# Patient Record
Sex: Male | Born: 1980 | Race: White | Hispanic: No | Marital: Married | Smoking: Never smoker
Health system: Southern US, Community
[De-identification: ages and names within clinical notes are randomized; demographics above are authoritative.]

## PROBLEM LIST (undated history)

## (undated) DIAGNOSIS — S3992XA Unspecified injury of lower back, initial encounter: Secondary | ICD-10-CM

## (undated) DIAGNOSIS — M2550 Pain in unspecified joint: Secondary | ICD-10-CM

## (undated) DIAGNOSIS — J302 Other seasonal allergic rhinitis: Secondary | ICD-10-CM

## (undated) DIAGNOSIS — T7840XA Allergy, unspecified, initial encounter: Secondary | ICD-10-CM

## (undated) DIAGNOSIS — R7303 Prediabetes: Secondary | ICD-10-CM

## (undated) DIAGNOSIS — K760 Fatty (change of) liver, not elsewhere classified: Secondary | ICD-10-CM

## (undated) DIAGNOSIS — M549 Dorsalgia, unspecified: Secondary | ICD-10-CM

## (undated) DIAGNOSIS — N200 Calculus of kidney: Secondary | ICD-10-CM

## (undated) DIAGNOSIS — Z9109 Other allergy status, other than to drugs and biological substances: Secondary | ICD-10-CM

## (undated) HISTORY — DX: Allergy, unspecified, initial encounter: T78.40XA

## (undated) HISTORY — DX: Unspecified injury of lower back, initial encounter: S39.92XA

## (undated) HISTORY — DX: Prediabetes: R73.03

## (undated) HISTORY — DX: Dorsalgia, unspecified: M54.9

## (undated) HISTORY — DX: Calculus of kidney: N20.0

## (undated) HISTORY — DX: Pain in unspecified joint: M25.50

## (undated) HISTORY — DX: Fatty (change of) liver, not elsewhere classified: K76.0

---
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
## 2012-06-24 ENCOUNTER — Ambulatory Visit: Payer: 59

## 2012-06-24 ENCOUNTER — Ambulatory Visit (INDEPENDENT_AMBULATORY_CARE_PROVIDER_SITE_OTHER): Payer: 59 | Admitting: Internal Medicine

## 2012-06-24 VITALS — BP 118/86 | HR 77 | Temp 97.6°F | Resp 16 | Ht 69.5 in | Wt 226.0 lb

## 2012-06-24 DIAGNOSIS — S1093XA Contusion of unspecified part of neck, initial encounter: Secondary | ICD-10-CM

## 2012-06-24 DIAGNOSIS — R51 Headache: Secondary | ICD-10-CM

## 2012-06-24 DIAGNOSIS — S0093XA Contusion of unspecified part of head, initial encounter: Secondary | ICD-10-CM

## 2012-06-24 DIAGNOSIS — S060X9A Concussion with loss of consciousness of unspecified duration, initial encounter: Secondary | ICD-10-CM

## 2012-06-24 MED ORDER — HYDROCODONE-ACETAMINOPHEN 5-500 MG PO TABS: 1.0000 | ORAL_TABLET | Freq: Three times a day (TID) | ORAL | Status: AC | PRN

## 2012-06-24 NOTE — Patient Instructions (Addendum)
Concussion Direct trauma to the head often causes a condition known as a concussion. This injury will interfere with brain function and may cause you to lose consciousness. The consequences of a concussion are usually temporary, but repetitive concussions can be very dangerous. If you have multiple concussions, you will have a greater risk of long-term effects, such as slurred speech, slow movements, impaired thinking, or tremors. The severity of a concussion is based on the length and severity of the interference with brain activity. SYMPTOMS  Symptoms of a concussion vary depending on the severity of the injury. Very mild concussions may even occur without any noticeable symptoms. Swelling in the area of the injury is not related to the seriousness of the injury.   Mild concussion:   Temporary loss of consciousness.   Memory loss (amnesia) for a short time.   Emotional instability.   Confusion.   Severe concussion:   Usually prolonged loss of consciousness.   One pupil (the black part in the middle of the eye) is larger than the other.   Changes in vision (including blurring).   Changes in breathing.   Disturbed balance (equilibrium).   Headaches.   Confusion.   Nausea or vomiting.  CAUSES  A concussion is the result of trauma to the head. When the head is subjected to such an injury, the brain strikes against the inner wall of the skull. This impact is what causes the damage to the brain. The force of injury is related to severity of injury. The most severe concussions are associated with incidents that involve large impact forces such as motor vehicle accidents. Wearing a helmet will reduce the severity of trauma to the head, but concussions may still occur if you are wearing a helmet. RISK INCREASES WITH:  Contact sports (football, hockey, rugby, or lacrosse).   Fighting sports (martial arts or boxing).   Riding bicycles, motorcycles, or horses (when you ride without a  helmet).  PREVENTION  Wear proper protective headgear and ensure correct fit.   Wear seat belts when driving and riding in a car.   Do not drink or use mind-altering drugs and drive.  PROGNOSIS  Concussions are typically curable if they are recognized and treated early. If a severe concussion or multiple concussions go untreated, then the complications may be life-threatening or cause permanent disability and brain damage. RELATED COMPLICATIONS   Permanent brain damage (slurred speech, slow movement, impaired thinking, or tremors).   Bleeding under the skull (subdural hemorrhage or hematoma, epidural hematoma).   Bleeding into the brain.   Prolonged healing time if usual activities are resumed too soon.   Infection if skin over the concussion site is broken.   Increased risk of future concussions (less trauma is required for a second concussion than the first).  TREATMENT  Treatment initially requires immediate evaluation to determine the severity of the concussion. Occasionally, a hospital stay may be required for observation and treatment.  Avoid exertion. Bed rest for the first 24 to 48 hours is recommended.  Return to play is a controversial subject due to the increased risk for future injury as well as permanent disability and should be discussed at length with your treating caregiver. Many factors such as the severity of the concussion and whether this is the first, second, or third concussion play a role in timing a patient's return to sports.  MEDICATION  Do not give any medicine, including non-prescription acetaminophen or aspirin, until the diagnosis is certain. These medicines may mask  developing symptoms.  SEEK IMMEDIATE MEDICAL CARE IF:   Symptoms get worse or do not improve in 24 hours.   Any of the following symptoms occur:   Vomiting.   The inability to move arms and legs equally well on both sides.   Fever.   Neck stiffness.   Pupils of unequal size, shape,  or reactivity.   Convulsions.   Noticeable restlessness.   Severe headache that persists for longer than 4 hours after injury.   Confusion, disorientation, or mental status changes.  Document Released: 11/12/2005 Document Revised: 11/01/2011 Document Reviewed: 02/24/2009 [facility] Patient Information 2012 [place], [place].

## 2012-06-24 NOTE — Progress Notes (Signed)
  Subjective:    Patient ID: Jerry Schmidt, male    DOB: Jul 25, 1981, 31 y.o.   MRN: [id number]  HPI Usually healthy guy was struk on left side of face by high fly ball yesterday. Worked all day but does not feel right. Fuzzy headed and mild dizzy. No loc, nausea. Just not thinking as fast. No focal nms loss. No diplopia or vision change. Does have left tmj pain and cannot chew nor open mouth but 25%.   Review of Systems     Objective:   Physical Exam  Constitutional: He is oriented to person, place, and time. He appears well-developed and well-nourished.  HENT:  Right Ear: External ear normal.  Left Ear: External ear normal.  Nose: Nose normal.  Eyes: Conjunctivae and EOM are normal. Pupils are equal, round, and reactive to light.  Neck: Normal range of motion. Neck supple.  Pulmonary/Chest: Effort normal.  Neurological: He is alert and oriented to person, place, and time. He has normal strength and normal reflexes. He is not disoriented. He displays no tremor and normal reflexes. No cranial nerve deficit or sensory deficit. He exhibits normal muscle tone. He displays a negative Romberg sign. Coordination and gait normal.       Balance excellent No drift Anxious  Skin: Skin is warm, dry and intact. Ecchymosis noted.          Edema left front-parietal-tmj area  Psychiatric: He has a normal mood and affect. His behavior is normal. Judgment and thought content normal.   [facility] reading (PRIMARY) by  Dr.Guest no fx seen         Assessment & Plan:  See dentist soon to asses TMJ and bite Head injury care RTC 2 days if not much better

## 2012-06-25 ENCOUNTER — Telehealth: Payer: Self-pay | Admitting: Radiology

## 2012-06-25 NOTE — Telephone Encounter (Signed)
Patient called today his rx was not sent in to [facility] last pm, I called it in for him.

## 2013-03-18 ENCOUNTER — Encounter (HOSPITAL_COMMUNITY): Payer: Self-pay | Admitting: *Deleted

## 2013-03-18 ENCOUNTER — Emergency Department (HOSPITAL_COMMUNITY)
Admission: EM | Admit: 2013-03-18 | Discharge: 2013-03-18 | Disposition: A | Discharge status: Home / Self Care | Payer: 59 | Source: Home / Self Care | Attending: Family Medicine | Admitting: Family Medicine

## 2013-03-18 DIAGNOSIS — J329 Chronic sinusitis, unspecified: Secondary | ICD-10-CM

## 2013-03-18 DIAGNOSIS — J40 Bronchitis, not specified as acute or chronic: Secondary | ICD-10-CM

## 2013-03-18 HISTORY — DX: Other allergy status, other than to drugs and biological substances: Z91.09

## 2013-03-18 HISTORY — DX: Other seasonal allergic rhinitis: J30.2

## 2013-03-18 MED ORDER — PREDNISONE 20 MG PO TABS: ORAL_TABLET | ORAL | Status: DC

## 2013-03-18 MED ORDER — ALBUTEROL SULFATE HFA 108 (90 BASE) MCG/ACT IN AERS: 1.0000 | INHALATION_SPRAY | Freq: Four times a day (QID) | RESPIRATORY_TRACT | Status: DC | PRN

## 2013-03-18 MED ORDER — GUAIFENESIN-CODEINE 100-10 MG/5ML PO SYRP: 5.0000 mL | ORAL_SOLUTION | Freq: Three times a day (TID) | ORAL | Status: DC | PRN

## 2013-03-18 MED ORDER — AZITHROMYCIN 250 MG PO TABS: ORAL_TABLET | ORAL | Status: DC

## 2013-03-18 MED ORDER — FLUTICASONE PROPIONATE 50 MCG/ACT NA SUSP
2.0000 | Freq: Every day | NASAL | Status: DC
Start: 2013-03-18 — End: 2013-12-07

## 2013-03-18 MED ORDER — FLUTICASONE PROPIONATE 50 MCG/ACT NA SUSP: 2.0000 | Freq: Every day | NASAL | Status: DC

## 2013-03-18 NOTE — ED Notes (Signed)
C/o prod. Cough yellowish green at night and clear during the day onset 2 weeks ago.  Taking Zyrtec.  C/o runny nose and post nasal drip.  No chills or fever or earache.  Has sore throat in AM from mouth breathing at night.

## 2013-03-18 NOTE — ED Provider Notes (Signed)
History     CSN: [id number]  Arrival date & time 03/18/13  1434   First MD Initiated Contact with Patient 03/18/13 1520      Chief Complaint  Patient presents with  . Cough    (Consider location/radiation/quality/duration/timing/severity/associated sxs/prior treatment) HPI Comments: 32 year old male with history of seasonal allergies and recurrent days. Here complaining of nasal congestion, rhinorrhea, postnasal drip, sneezing, watery eyes, associated with productive cough and wheezing for 2 weeks. Patient reports worsening symptoms with thickening of his nasal discharge and sputum, he is currently unable to breathe through his nose. Also reports chest pressure and decreased energy. Denies fever or chills. Denies appetite changes. No abdominal pain nausea vomiting or diarrhea. Taking Zyrtec daily. Has used heat child albuterol with some relief.   Past Medical History  Diagnosis Date  . Pollen allergies   . Seasonal allergies     Past Surgical History  Procedure Laterality Date  . 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 No pertinent family history.  History  Substance Use Topics  . Smoking status: Never Smoker   . Smokeless tobacco: Not on file  . Alcohol Use: No      Review of Systems  Constitutional: Positive for fatigue. Negative for fever, chills, diaphoresis and appetite change.  HENT: Positive for congestion, rhinorrhea, sneezing, postnasal drip and sinus pressure. Negative for trouble swallowing.   Respiratory: Positive for cough, chest tightness, shortness of breath and wheezing.   Cardiovascular: Negative for chest pain, palpitations and leg swelling.  Gastrointestinal: Negative for nausea, vomiting, abdominal pain and diarrhea.  Musculoskeletal: Negative for arthralgias.  Skin: Negative for rash.  Neurological: Positive for headaches.    Allergies  Review of patient's allergies indicates no known  allergies.  Home Medications   Current Outpatient Rx  Name  Route  Sig  Dispense  Refill  . cetirizine (ZYRTEC) 10 MG tablet   Oral   Take 10 mg by mouth daily.         Marland Kitchen albuterol (PROVENTIL HFA;VENTOLIN HFA) 108 (90 BASE) MCG/ACT inhaler   Inhalation   Inhale 1-2 puffs into the lungs every 6 (six) hours as needed for wheezing.   1 Inhaler   0   . azithromycin (ZITHROMAX) 250 MG tablet      2 tabs by mouth on day one then one tablet daily for 4 more days.   6 tablet   0   . fluticasone (FLONASE) 50 MCG/ACT nasal spray   Nasal   Place 2 sprays into the nose daily.   16 g   0   . guaiFENesin-codeine (ROBITUSSIN AC) 100-10 MG/5ML syrup   Oral   Take 5 mLs by mouth 3 (three) times daily as needed for cough.   120 mL   0   . predniSONE (DELTASONE) 20 MG tablet      2 tabs po daily for 5 days   10 tablet   0     BP 135/74  Pulse 97  Temp(Src) 98.1 F (36.7 C) (Oral)  Resp 18  SpO2 95%  Physical Exam  Nursing note and vitals reviewed. Constitutional: He is oriented to person, place, and time. He appears well-developed and well-nourished. No distress.  HENT:  Head: Normocephalic and atraumatic.  Nasal Congestion with erythema and swelling of nasal turbinates, yellow rhinorrhea. Significant pharyngeal erythema no exudates. No uvula deviation. No trismus. TM's normal  Neck: Neck supple. No JVD present.  Cardiovascular: Normal rate, regular rhythm, normal heart sounds and intact distal pulses.  Exam reveals no gallop and no friction rub.   No murmur heard. Pulmonary/Chest: Effort normal. He has no wheezes. He has no rales. He exhibits no tenderness.  Bilaterally intermittent expiratory rhonchi. No active wheezing. No orthopnea. No tachypnea.  Lymphadenopathy:    He has no cervical adenopathy.  Neurological: He is alert and oriented to person, place, and time.  Skin: No rash noted. He is not diaphoretic.    ED Course  Procedures (including critical care  time)  Labs Reviewed - No data to display No results found.   1. Bronchitis   2. Sinusitis       MDM  Treated with albuterol, prednisone, guaifenesin/codeine, fluticasone and a azithromycin. Supportive care and red flags should prompt his return to medical attention discussed with patient and provided in writing.        Sharin Grave, MD 03/18/13 1654

## 2013-12-07 ENCOUNTER — Encounter (HOSPITAL_COMMUNITY): Payer: Self-pay | Admitting: Emergency Medicine

## 2013-12-07 ENCOUNTER — Emergency Department (INDEPENDENT_AMBULATORY_CARE_PROVIDER_SITE_OTHER)
Admission: EM | Admit: 2013-12-07 | Discharge: 2013-12-07 | Disposition: A | Discharge status: Home / Self Care | Payer: 59 | Source: Home / Self Care | Attending: Family Medicine | Admitting: Family Medicine

## 2013-12-07 DIAGNOSIS — J329 Chronic sinusitis, unspecified: Secondary | ICD-10-CM

## 2013-12-07 MED ORDER — AMOXICILLIN-POT CLAVULANATE 875-125 MG PO TABS: 1.0000 | ORAL_TABLET | Freq: Two times a day (BID) | ORAL | Status: DC

## 2013-12-07 MED ORDER — IPRATROPIUM BROMIDE 0.06 % NA SOLN: 2.0000 | Freq: Four times a day (QID) | NASAL | Status: DC

## 2013-12-07 MED ORDER — PREDNISONE 10 MG PO TABS: 30.0000 mg | ORAL_TABLET | Freq: Every day | ORAL | Status: DC

## 2013-12-07 MED ORDER — GUAIFENESIN-CODEINE 100-10 MG/5ML PO SOLN: 5.0000 mL | Freq: Every evening | ORAL | Status: DC | PRN

## 2013-12-07 NOTE — ED Notes (Addendum)
Visual  Acuity  20/15  l  20/20  r  20/20  both

## 2013-12-07 NOTE — ED Provider Notes (Signed)
Lorn JunesChristopher C Gest is a 33 y.o. male who presents to Urgent Care today for fevers chills sinus pain and pressure. Pressure and pain is worse behind the left eye and left forehead. She additionally notes purulent nasal discharge. He also has subjective fevers chills and sweats. His symptoms are present for 2 weeks however over the past day or so they have been worsening. He's tried NyQuil, DayQuil, and Zyrtec. This is helped only a little. His pain is moderate. He denies any significant.    Past Medical History  Diagnosis Date  . Pollen allergies   . Seasonal allergies    History  Substance Use Topics  . Smoking status: Never Smoker   . Smokeless tobacco: Not on file  . Alcohol Use: Yes   ROS as above Medications reviewed. No current facility-administered medications for this encounter.   Current Outpatient Prescriptions  Medication Sig Dispense Refill  . albuterol (PROVENTIL HFA;VENTOLIN HFA) 108 (90 BASE) MCG/ACT inhaler Inhale 1-2 puffs into the lungs every 6 (six) hours as needed for wheezing.  1 Inhaler  0  . amoxicillin-clavulanate (AUGMENTIN) 875-125 MG per tablet Take 1 tablet by mouth every 12 (twelve) hours.  14 tablet  0  . guaiFENesin-codeine 100-10 MG/5ML syrup Take 5 mLs by mouth at bedtime as needed for cough.  120 mL  0  . ipratropium (ATROVENT) 0.06 % nasal spray Place 2 sprays into both nostrils 4 (four) times daily.  15 mL  1  . predniSONE (DELTASONE) 10 MG tablet Take 3 tablets (30 mg total) by mouth daily.  15 tablet  0  . [DISCONTINUED] cetirizine (ZYRTEC) 10 MG tablet Take 10 mg by mouth daily.      . [DISCONTINUED] fluticasone (FLONASE) 50 MCG/ACT nasal spray Place 2 sprays into the nose daily.  16 g  0    Exam:  BP 140/84  Pulse 88  Temp(Src) 99.4 F (37.4 C) (Oral)  Resp 18  SpO2 99% Gen: Well NAD HEENT: EOMI,  MMM, PERRLA. The patient membranes are normal appearing bilaterally. Posterior pharynx with cobblestoning. Nasal turbinates are mildly inflamed.  Tender palpation left frontal sinus. Lungs: Normal work of breathing. CTABL Heart: RRR no MRG Abd: NABS, Soft. NT, ND Exts: Non edematous BL  LE, warm and well perfused.     Visual Acuity  Right Eye Distance:   20/20 Left Eye Distance:   20/15 Bilateral Distance:   20/20   Assessment and Plan: 33 y.o. male with sinusitis. Plan to treat with Augmentin, low dose prednisone, Atrovent nasal spray, and NSAIDs. We will also use codeine containing cough medication for use at bedtime as needed. I verbally cautioned patient against using this medication at work or while driving. Additionally we'll provide work.  Discussed warning signs or symptoms. Please see discharge instructions. Patient expresses understanding.    Rodolph BongEvan S Chayce Rullo, MD 12/07/13 [phone]

## 2013-12-07 NOTE — ED Notes (Signed)
Pt  Reports  Symptoms  Of a  persistant  Cough  With  Sinus  Congestion  /  Drainage        With  The  Symptoms  X  2  Weeks           Pt  Reports  The     Symptoms  Of a  sorethroat  As  Well

## 2013-12-07 NOTE — Discharge Instructions (Signed)
Thank you for coming in today. Take Augmentin twice daily for one week. Used prednisone daily for one week. Use Atrovent nasal spray. Take up to 2 Aleve twice daily for pain. Call or go to the emergency room if you get worse, have trouble breathing, have chest pains, or palpitations.   Sinusitis Sinusitis is redness, soreness, and swelling (inflammation) of the paranasal sinuses. Paranasal sinuses are air pockets within the bones of your face (beneath the eyes, the middle of the forehead, or above the eyes). In healthy paranasal sinuses, mucus is able to drain out, and air is able to circulate through them by way of your nose. However, when your paranasal sinuses are inflamed, mucus and air can become trapped. This can allow bacteria and other germs to grow and cause infection. Sinusitis can develop quickly and last only a short time (acute) or continue over a long period (chronic). Sinusitis that lasts for more than 12 weeks is considered chronic.  CAUSES  Causes of sinusitis include:  Allergies.  Structural abnormalities, such as displacement of the cartilage that separates your nostrils (deviated septum), which can decrease the air flow through your nose and sinuses and affect sinus drainage.  Functional abnormalities, such as when the small hairs (cilia) that line your sinuses and help remove mucus do not work properly or are not present. SYMPTOMS  Symptoms of acute and chronic sinusitis are the same. The primary symptoms are pain and pressure around the affected sinuses. Other symptoms include:  Upper toothache.  Earache.  Headache.  Bad breath.  Decreased sense of smell and taste.  A cough, which worsens when you are lying flat.  Fatigue.  Fever.  Thick drainage from your nose, which often is green and may contain pus (purulent).  Swelling and warmth over the affected sinuses. DIAGNOSIS  Your caregiver will perform a physical exam. During the exam, your caregiver  may:  Look in your nose for signs of abnormal growths in your nostrils (nasal polyps).  Tap over the affected sinus to check for signs of infection.  View the inside of your sinuses (endoscopy) with a special imaging device with a light attached (endoscope), which is inserted into your sinuses. If your caregiver suspects that you have chronic sinusitis, one or more of the following tests may be recommended:  Allergy tests.  Nasal culture A sample of mucus is taken from your nose and sent to a lab and screened for bacteria.  Nasal cytology A sample of mucus is taken from your nose and examined by your caregiver to determine if your sinusitis is related to an allergy. TREATMENT  Most cases of acute sinusitis are related to a viral infection and will resolve on their own within 10 days. Sometimes medicines are prescribed to help relieve symptoms (pain medicine, decongestants, nasal steroid sprays, or saline sprays).  However, for sinusitis related to a bacterial infection, your caregiver will prescribe antibiotic medicines. These are medicines that will help kill the bacteria causing the infection.  Rarely, sinusitis is caused by a fungal infection. In theses cases, your caregiver will prescribe antifungal medicine. For some cases of chronic sinusitis, surgery is needed. Generally, these are cases in which sinusitis recurs more than 3 times per year, despite other treatments. HOME CARE INSTRUCTIONS   Drink plenty of water. Water helps thin the mucus so your sinuses can drain more easily.  Use a humidifier.  Inhale steam 3 to 4 times a day (for example, sit in the bathroom with the shower running).  Apply a warm, moist washcloth to your face 3 to 4 times a day, or as directed by your caregiver.  Use saline nasal sprays to help moisten and clean your sinuses.  Take over-the-counter or prescription medicines for pain, discomfort, or fever only as directed by your caregiver. SEEK IMMEDIATE  MEDICAL CARE IF:  You have increasing pain or severe headaches.  You have nausea, vomiting, or drowsiness.  You have swelling around your face.  You have vision problems.  You have a stiff neck.  You have difficulty breathing. MAKE SURE YOU:   Understand these instructions.  Will watch your condition.  Will get help right away if you are not doing well or get worse. Document Released: 11/12/2005 Document Revised: 02/04/2012 Document Reviewed: 11/27/2011 [facility] Patient Information 2014 [place].

## 2014-02-25 ENCOUNTER — Ambulatory Visit (INDEPENDENT_AMBULATORY_CARE_PROVIDER_SITE_OTHER)
Admission: RE | Admit: 2014-02-25 | Discharge: 2014-02-25 | Disposition: A | Discharge status: Home / Self Care | Payer: 59 | Source: Ambulatory Visit | Attending: Family Medicine | Admitting: Family Medicine

## 2014-02-25 ENCOUNTER — Encounter: Payer: Self-pay | Admitting: Family Medicine

## 2014-02-25 ENCOUNTER — Ambulatory Visit (INDEPENDENT_AMBULATORY_CARE_PROVIDER_SITE_OTHER): Payer: 59 | Admitting: Family Medicine

## 2014-02-25 VITALS — BP 126/66 | HR 82 | Temp 98.1°F | Ht 69.25 in | Wt 230.5 lb

## 2014-02-25 DIAGNOSIS — IMO0002 Reserved for concepts with insufficient information to code with codable children: Secondary | ICD-10-CM

## 2014-02-25 DIAGNOSIS — Z136 Encounter for screening for cardiovascular disorders: Secondary | ICD-10-CM

## 2014-02-25 DIAGNOSIS — S3992XA Unspecified injury of lower back, initial encounter: Secondary | ICD-10-CM

## 2014-02-25 DIAGNOSIS — Z0000 Encounter for general adult medical examination without abnormal findings: Secondary | ICD-10-CM

## 2014-02-25 DIAGNOSIS — R5383 Other fatigue: Secondary | ICD-10-CM

## 2014-02-25 DIAGNOSIS — R5381 Other malaise: Secondary | ICD-10-CM

## 2014-02-25 HISTORY — DX: Unspecified injury of lower back, initial encounter: S39.92XA

## 2014-02-25 LAB — CBC WITH DIFFERENTIAL/PLATELET
Basophils Absolute: 0 10*3/uL (ref 0.0–0.1)
Basophils Relative: 0 % (ref 0–1)
EOS ABS: 0.3 10*3/uL (ref 0.0–0.7)
Eosinophils Relative: 4 % (ref 0–5)
HCT: 40 % (ref 39.0–52.0)
HEMOGLOBIN: 14 g/dL (ref 13.0–17.0)
LYMPHS ABS: 2 10*3/uL (ref 0.7–4.0)
LYMPHS PCT: 30 % (ref 12–46)
MCH: 27.7 pg (ref 26.0–34.0)
MCHC: 35 g/dL (ref 30.0–36.0)
MCV: 79.1 fL (ref 78.0–100.0)
MONOS PCT: 7 % (ref 3–12)
Monocytes Absolute: 0.5 10*3/uL (ref 0.1–1.0)
NEUTROS PCT: 59 % (ref 43–77)
Neutro Abs: 4 10*3/uL (ref 1.7–7.7)
Platelets: 177 10*3/uL (ref 150–400)
RBC: 5.06 MIL/uL (ref 4.22–5.81)
RDW: 14.3 % (ref 11.5–15.5)
WBC: 6.7 10*3/uL (ref 4.0–10.5)

## 2014-02-25 MED ORDER — CYCLOBENZAPRINE HCL 5 MG PO TABS: ORAL_TABLET | ORAL | Status: DC

## 2014-02-25 NOTE — Assessment & Plan Note (Signed)
No TTP over spine- exam reassuring. Probable muscle spasm. Will treat with prn flexeril and heat at bedtime. Given duration of symptoms, will get xray today.  Call or return to clinic prn if these symptoms worsen or fail to improve as anticipated. The patient indicates understanding of these issues and agrees with the plan.

## 2014-02-25 NOTE — Progress Notes (Signed)
Pre visit review using our clinic review tool, if applicable. No additional management support is needed unless otherwise documented below in the visit note. 

## 2014-02-25 NOTE — Assessment & Plan Note (Signed)
Reviewed preventive care protocols, scheduled due services, and updated immunizations Discussed nutrition, exercise, diet, and healthy lifestyle. Orders Placed This Encounter  Procedures  . DG Thoracic Spine W/Swimmers  . Comprehensive metabolic panel  . Lipid panel  . Vitamin B12  . CBC with Differential

## 2014-02-25 NOTE — Progress Notes (Signed)
   Subjective:   Patient ID: Jerry Schmidt, male    DOB: 10/12/1981, 33 y.o.   MRN: [id number]  Jerry Schmidt is a pleasant 33 y.o. year old male who presents to clinic today with Establish Care and Back Pain  on 02/25/2014  HPI: Due for CPX.   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  Has been more fatigued lately.  Works long hours.  Denies depression, decreased sex drive or difficulty getting or maintaining an erection.  Back injury- fell off back of his truck (approx 2 feet) 1 month ago.  Landed on his back.  Since then, upper back hurts.  Worse after long day at work.  Has not tried heating pad but NSAIDs have not worked.  No tingling in UE.  No UE weakness.  Patient Active Problem List   Diagnosis Date Noted  . Back injury 02/25/2014  . Routine general medical examination at a health care facility 02/25/2014   Past Medical History  Diagnosis Date  . Pollen allergies   . Seasonal allergies    Past Surgical History  Procedure Laterality Date  . 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 Smoking status: Never Smoker   . Smokeless tobacco: Not on file  . Alcohol Use: Yes   No family history on file. No Known Allergies Current Outpatient Prescriptions on File Prior to Visit  Medication Sig Dispense Refill  . [DISCONTINUED] cetirizine (ZYRTEC) 10 MG tablet Take 10 mg by mouth daily.      . [DISCONTINUED] fluticasone (FLONASE) 50 MCG/ACT nasal spray Place 2 sprays into the nose daily.  16 g  0   No current facility-administered medications on file prior to visit.   The PMH, PSH, Social History, Family History, Medications, and allergies have been [facility], and have been updated if relevant.   Review of Systems    See HPI Denies HA No blurred vision No change in bowel habits +dry skin  Objective:    BP 126/66  Pulse 82  Temp(Src) 98.1 F (36.7 C) (Oral)  Ht  5' 9.25" (1.759 m)  Wt 230 lb 8 oz (104.554 kg)  BMI 33.79 kg/m2  SpO2 98%   Physical Exam  Nursing note and vitals reviewed. Constitutional: He is oriented to person, place, and time. He appears well-developed and well-nourished. No distress.  HENT:  Head: Normocephalic and atraumatic.  Eyes: Pupils are equal, round, and reactive to light.  Neck: Normal range of motion. Neck supple.  Cardiovascular: Normal rate and regular rhythm.   No murmur heard. Pulmonary/Chest: Effort normal and breath sounds normal. No respiratory distress.  Abdominal: Soft. Bowel sounds are normal.  Musculoskeletal: He exhibits no edema.       Thoracic back: He exhibits spasm. He exhibits normal range of motion, no tenderness, no bony tenderness, no swelling, no edema, no deformity, no laceration, no pain and normal pulse.  Neurological: He is alert and oriented to person, place, and time.  Skin: Skin is warm and dry.  Psychiatric: He has a normal mood and affect. His behavior is normal. Judgment and thought content normal.          Assessment & Plan:   Back injury No Follow-up on file.

## 2014-02-25 NOTE — Patient Instructions (Signed)
Nice to meet you. We will call you with your xray and lab results.  Take flexeril as needed at bedtime. Use heating pad at night.

## 2014-02-26 LAB — LIPID PANEL
CHOLESTEROL: 187 mg/dL (ref 0–200)
HDL: 30 mg/dL — ABNORMAL LOW (ref >39)
LDL CALC: 79 mg/dL (ref 0–99)
TRIGLYCERIDES: 390 mg/dL — AB (ref <150)
Total CHOL/HDL Ratio: 6.2 Ratio
VLDL: 78 mg/dL — ABNORMAL HIGH (ref 0–40)

## 2014-02-26 LAB — COMPREHENSIVE METABOLIC PANEL
ALBUMIN: 4.6 g/dL (ref 3.5–5.2)
ALT: 36 U/L (ref 0–53)
AST: 20 U/L (ref 0–37)
Alkaline Phosphatase: 52 U/L (ref 39–117)
BUN: 18 mg/dL (ref 6–23)
CO2: 32 meq/L (ref 19–32)
Calcium: 9.5 mg/dL (ref 8.4–10.5)
Chloride: 103 mEq/L (ref 96–112)
Creat: 1.02 mg/dL (ref 0.50–1.35)
GLUCOSE: 55 mg/dL — AB (ref 70–99)
POTASSIUM: 4 meq/L (ref 3.5–5.3)
SODIUM: 140 meq/L (ref 135–145)
TOTAL PROTEIN: 6.9 g/dL (ref 6.0–8.3)
Total Bilirubin: 0.9 mg/dL (ref 0.2–1.2)

## 2014-02-26 LAB — VITAMIN B12: Vitamin B-12: 269 pg/mL (ref 211–911)

## 2014-03-01 ENCOUNTER — Encounter: Payer: Self-pay | Admitting: *Deleted

## 2014-07-01 ENCOUNTER — Encounter (HOSPITAL_COMMUNITY): Payer: Self-pay | Admitting: Emergency Medicine

## 2014-07-01 ENCOUNTER — Emergency Department (INDEPENDENT_AMBULATORY_CARE_PROVIDER_SITE_OTHER)
Admission: EM | Admit: 2014-07-01 | Discharge: 2014-07-01 | Disposition: A | Discharge status: Home / Self Care | Payer: 59 | Source: Home / Self Care | Attending: Emergency Medicine | Admitting: Emergency Medicine

## 2014-07-01 DIAGNOSIS — J069 Acute upper respiratory infection, unspecified: Secondary | ICD-10-CM

## 2014-07-01 DIAGNOSIS — J019 Acute sinusitis, unspecified: Secondary | ICD-10-CM

## 2014-07-01 MED ORDER — AMOXICILLIN 500 MG PO CAPS: 1000.0000 mg | ORAL_CAPSULE | Freq: Three times a day (TID) | ORAL | Status: DC

## 2014-07-01 MED ORDER — PREDNISONE 20 MG PO TABS: 20.0000 mg | ORAL_TABLET | Freq: Two times a day (BID) | ORAL | Status: DC

## 2014-07-01 NOTE — Discharge Instructions (Signed)
Most upper respiratory infections are caused by viruses and do not require antibiotics.  We try to save the antibiotics for when we really need them to prevent bacteria from developing resistance to them.  Here are a few hints about things that can be done at home to help get over an upper respiratory infection quicker: ° °Get extra sleep and extra fluids.  Get 7 to 9 hours of sleep per night and 6 to 8 glasses of water a day.  Getting extra sleep keeps the immune system from getting run down.  Most people with an upper respiratory infection are a little dehydrated.  The extra fluids also keep the secretions liquified and easier to deal with.  Also, get extra vitamin C.  4000 mg per day is the recommended dose. °For the aches, headache, and fever, acetaminophen or ibuprofen are helpful.  These can be alternated every 4 hours.  People with liver disease should avoid large amounts of acetaminophen, and people with ulcer disease, gastroesophageal reflux, gastritis, congestive heart failure, chronic kidney disease, coronary artery disease and the elderly should avoid ibuprofen. °For nasal congestion try Mucinex-D, or if you're having lots of sneezing or clear nasal drainage use Zyrtec-D. People with high blood pressure can take these if their blood pressure is controlled, if not, it's best to avoid the forms with a "D" (decongestants).  You can use the plain Mucinex, Allegra, Claritin, or Zyrtec even if your blood pressure is not controlled.   °A Saline nasal spray such as Ocean Spray can also help.  You can add a decongestant sprays such as Afrin, but you should not use the decongestant sprays for more than 3 or 4 days since they can be habituating.  Breathe Rite nasal strips can also offer a non-drug alternative treatment to nasal congestion, especially at night. °For people with symptoms of sinusitis, sleeping with your head elevated can be helpful.  For sinus pain, moist, hot compresses to the face may provide some  relief.  Many people find that inhaling steam as in a shower or from a pot of steaming water can help. °For any viral infection, zinc containing lozenges such as Cold-Eze or Zicam are helpful.  Zinc helps to fight viral infection.  Hot salt water gargles (8 oz of hot water, 1/2 tsp of table salt, and a pinch of baking soda) can give relief as well as hot beverages such as hot tea.  Sucrets extra strength lozenges will help the sore throat.  °For the cough, take Delsym 2 tsp every 12 hours.  It has also been found recently that Aleve can help control a cough.  The dose is 1 to 2 tablets twice daily with food.  This can be combined with Delsym. (Note, if you are taking ibuprofen, you should not take Aleve as well--take one or the other.) °A cool mist vaporizer will help keep your mucous membranes from drying out.  ° °It's important when you have an upper respiratory infection not to pass the infection to others.  This involves being very careful about the following: ° °Frequent hand washing or use of hand sanitizer, especially after coughing, sneezing, blowing your nose or touching your face, nose or eyes. °Do not shake hands or touch anyone and try to avoid touching surfaces that other people use such as doorknobs, shopping carts, telephones and computer keyboards. °Use tissues and dispose of them properly in a garbage can or ziplock bag. °Cough into your sleeve. °Do not let others eat or   drink after you. ° °It's also important to recognize the signs of serious illness and get evaluated if they occur: °Any respiratory infection that lasts more than 7 to 10 days.  Yellow nasal drainage and sputum are not reliable indicators of a bacterial infection, but if they last for more than 1 week, see your doctor. °Fever and sore throat can indicate strep. °Fever and cough can indicate influenza or pneumonia. °Any kind of severe symptom such as difficulty breathing, intractable vomiting, or severe pain should prompt you to see  a doctor as soon as possible. ° ° °Your body's immune system is really the thing that will get rid of this infection.  Your immune system is comprised of 2 types of specialized cells called T cells and B cells.  T cells coordinate the array of cells in your body that engulf invading bacteria or viruses while B cells orchestrate the production of antibodies that neutralize infection.  Anything we do or any medications we give you, will just strengthen your immune system or help it clear up the infection quicker.  Here are a few helpful hints to improve your immune system to help overcome this illness or to prevent future infections: °· A few vitamins can improve the health of your immune system.  That's why your diet should include plenty of fruits, vegetables, fish, nuts, and whole grains. °· Vitamin A and bet-carotene can increase the cells that fight infections (T cells and B cells).  Vitamin A is abundant in dark greens and orange vegetables such as spinach, greens, sweet potatoes, and carrots. °· Vitamin B6 contributes to the maturation of white blood cells, the cells that fight disease.  Foods with vitamin B6 include cold cereal and bananas. °· Vitamin C is credited with preventing colds because it increases white blood cells and also prevents cellular damage.  Citrus fruits, peaches and green and red bell peppers are all hight in vitamin C. °· Vitamin E is an anti-oxidant that encourages the production of natural killer cells which reject foreign invaders and B cells that produce antibodies.  Foods high in vitamin E include wheat germ, nuts and seeds. °· Foods high in omega-3 fatty acids found in foods like salmon, tuna and mackerel boost your immune system and help cells to engulf and absorb germs. °· Probiotics are good bacteria that increase your T cells.  These can be found in yogurt and are available in supplements such as Culturelle or Align. °· Moderate exercise increases the strength of your immune  system and your ability to recover from illness.  I suggest 3 to 5 moderate intensity 30 minute workouts per week.   °· Sleep is another component of maintaining a strong immune system.  It enables your body to recuperate from the day's activities, stress and work.  My recommendation is to get between 7 and 9 hours of sleep per night. °· If you smoke, try to quit completely or at least cut down.  Drink alcohol only in moderation if at all.  No more than 2 drinks daily for men or 1 for women. °· Get a flu vaccine early in the fall or if you have not gotten one yet, once this illness has run its course.  If you are over 65, a smoker, or an asthmatic, get a pneumococcal vaccine. °· My final recommendation is to maintain a healthy weight.  Excess weight can impair the immune system by interfering with the way the immune system deals with invading viruses or   bacteria.   Sinusitis Sinusitis is redness, soreness, and inflammation of the paranasal sinuses. Paranasal sinuses are air pockets within the bones of your face (beneath the eyes, the middle of the forehead, or above the eyes). In healthy paranasal sinuses, mucus is able to drain out, and air is able to circulate through them by way of your nose. However, when your paranasal sinuses are inflamed, mucus and air can become trapped. This can allow bacteria and other germs to grow and cause infection. Sinusitis can develop quickly and last only a short time (acute) or continue over a long period (chronic). Sinusitis that lasts for more than 12 weeks is considered chronic.  CAUSES  Causes of sinusitis include:  Allergies.  Structural abnormalities, such as displacement of the cartilage that separates your nostrils (deviated septum), which can decrease the air flow through your nose and sinuses and affect sinus drainage.  Functional abnormalities, such as when the small hairs (cilia) that line your sinuses and help remove mucus do not work properly or are not  present. SIGNS AND SYMPTOMS  Symptoms of acute and chronic sinusitis are the same. The primary symptoms are pain and pressure around the affected sinuses. Other symptoms include:  Upper toothache.  Earache.  Headache.  Bad breath.  Decreased sense of smell and taste.  A cough, which worsens when you are lying flat.  Fatigue.  Fever.  Thick drainage from your nose, which often is green and may contain pus (purulent).  Swelling and warmth over the affected sinuses. DIAGNOSIS  Your health care provider will perform a physical exam. During the exam, your health care provider may:  Look in your nose for signs of abnormal growths in your nostrils (nasal polyps).  Tap over the affected sinus to check for signs of infection.  View the inside of your sinuses (endoscopy) using an imaging device that has a light attached (endoscope). If your health care provider suspects that you have chronic sinusitis, one or more of the following tests may be recommended:  Allergy tests.  Nasal culture. A sample of mucus is taken from your nose, sent to a lab, and screened for bacteria.  Nasal cytology. A sample of mucus is taken from your nose and examined by your health care provider to determine if your sinusitis is related to an allergy. TREATMENT  Most cases of acute sinusitis are related to a viral infection and will resolve on their own within 10 days. Sometimes medicines are prescribed to help relieve symptoms (pain medicine, decongestants, nasal steroid sprays, or saline sprays).  However, for sinusitis related to a bacterial infection, your health care provider will prescribe antibiotic medicines. These are medicines that will help kill the bacteria causing the infection.  Rarely, sinusitis is caused by a fungal infection. In theses cases, your health care provider will prescribe antifungal medicine. For some cases of chronic sinusitis, surgery is needed. Generally, these are cases in which  sinusitis recurs more than 3 times per year, despite other treatments. HOME CARE INSTRUCTIONS   Drink plenty of water. Water helps thin the mucus so your sinuses can drain more easily.  Use a humidifier.  Inhale steam 3 to 4 times a day (for example, sit in the bathroom with the shower running).  Apply a warm, moist washcloth to your face 3 to 4 times a day, or as directed by your health care provider.  Use saline nasal sprays to help moisten and clean your sinuses.  Take medicines only as directed by your  health care provider.  If you were prescribed either an antibiotic or antifungal medicine, finish it all even if you start to feel better. SEEK IMMEDIATE MEDICAL CARE IF:  You have increasing pain or severe headaches.  You have nausea, vomiting, or drowsiness.  You have swelling around your face.  You have vision problems.  You have a stiff neck.  You have difficulty breathing. MAKE SURE YOU:   Understand these instructions.  Will watch your condition.  Will get help right away if you are not doing well or get worse. Document Released: 11/12/2005 Document Revised: 03/29/2014 Document Reviewed: 11/27/2011 [facility] Patient Information 2015 [employer], [place]. This information is not intended to replace advice given to you by your health care provider. Make sure you discuss any questions you have with your health care provider.

## 2014-07-01 NOTE — ED Provider Notes (Signed)
  Chief Complaint   Chief Complaint  Patient presents with  . URI    History of Present Illness   Jerry Schmidt is a 33 year old male who has had a one-week history of nasal congestion with green drainage. This is worse on the right side than left. He's had a headache, sinus pain and pressure, right ear pain and pressure, and cough productive yellow sputum and he has pain in his right mid back area, sore throat, and nausea. Denies fever, chills, sweats, wheezing, chest tightness, or anterior chest pain. No vomiting or diarrhea. No sick exposures. Has had sinusitis in the past.  Review of Systems   Other than as noted above, the patient denies any of the following symptoms: Systemic:  No fevers, chills, sweats, or myalgias. Eye:  No redness or discharge. ENT:  No ear pain, headache, nasal congestion, drainage, sinus pressure, or sore throat. Neck:  No neck pain, stiffness, or swollen glands. Lungs:  No cough, sputum production, hemoptysis, wheezing, chest tightness, shortness of breath or chest pain. GI:  No abdominal pain, nausea, vomiting or diarrhea.  PMFSH   Past medical history, family history, social history, meds, and allergies were reviewed.   Physical exam   Vital signs:  BP 137/84  Pulse 83  Temp(Src) 98.5 F (36.9 C) (Oral)  Resp 18  SpO2 96% General:  Alert and oriented.  In no distress.  Skin warm and dry. Eye:  No conjunctival injection or drainage. Lids were normal. ENT:  TMs and canals were normal, without erythema or inflammation.  Nasal mucosa was congested particularly in the right side with yellowish drainage.  Mucous membranes were moist.  Pharynx was clear with no exudate or drainage.  There were no oral ulcerations or lesions. Neck:  Supple, no adenopathy, tenderness or mass. Lungs:  No respiratory distress.  Lungs were clear to auscultation, without wheezes, rales or rhonchi.  Breath sounds were clear and equal bilaterally.  Heart:  Regular rhythm,  without gallops, murmers or rubs. Skin:  Clear, warm, and dry, without rash or lesions.  Assessment     The primary encounter diagnosis was Viral URI. A diagnosis of Acute sinusitis, recurrence not specified, unspecified location was also pertinent to this visit.  Plan    1.  Meds:  The following meds were prescribed:   Discharge Medication List as of 07/01/2014  3:01 PM    START taking these medications   Details  amoxicillin (AMOXIL) 500 MG capsule Take 2 capsules (1,000 mg total) by mouth 3 (three) times daily., Starting 07/01/2014, Until Discontinued, Normal    predniSONE (DELTASONE) 20 MG tablet Take 1 tablet (20 mg total) by mouth 2 (two) times daily., Starting 07/01/2014, Until Discontinued, Normal        2.  Patient Education/Counseling:  The patient was given appropriate handouts, self care instructions, and instructed in symptomatic relief.  Instructed to get extra fluids, rest, and use a cool mist vaporizer.    3.  Follow up:  The patient was told to follow up here if no better in 3 to 4 days, or sooner if becoming worse in any way, and given some red flag symptoms such as increasing fever, difficulty breathing, chest pain, or persistent vomiting which would prompt immediate return.  Follow up here as needed.      Reuben Likesavid C Doxie Augenstein, MD 07/01/14 [phone]

## 2014-07-01 NOTE — ED Notes (Signed)
Pt  Reports  Symptoms  Of  Sinus  Congestion    With   Cough  And  Pain  r  Ear  Area    Pt  Reports  Symptoms  Of greenish sputum  And  Sinus  Drainage  And  Pressure  In sinus  Areas

## 2014-11-01 ENCOUNTER — Ambulatory Visit (INDEPENDENT_AMBULATORY_CARE_PROVIDER_SITE_OTHER): Payer: 59 | Admitting: Physician Assistant

## 2014-11-01 VITALS — BP 118/70 | HR 88 | Temp 98.4°F | Resp 18 | Ht 70.0 in | Wt 239.8 lb

## 2014-11-01 DIAGNOSIS — J020 Streptococcal pharyngitis: Secondary | ICD-10-CM

## 2014-11-01 DIAGNOSIS — J029 Acute pharyngitis, unspecified: Secondary | ICD-10-CM

## 2014-11-01 LAB — POCT RAPID STREP A (OFFICE): RAPID STREP A SCREEN: POSITIVE — AB

## 2014-11-01 MED ORDER — AMOXICILLIN 875 MG PO TABS: 875.0000 mg | ORAL_TABLET | Freq: Two times a day (BID) | ORAL | Status: DC

## 2014-11-01 MED ORDER — ACETAMINOPHEN 500 MG PO TABS: 1000.0000 mg | ORAL_TABLET | Freq: Three times a day (TID) | ORAL | Status: DC | PRN

## 2014-11-01 MED ORDER — IBUPROFEN 800 MG PO TABS: ORAL_TABLET | ORAL | Status: DC

## 2014-11-01 NOTE — Progress Notes (Signed)
I have discussed this case with Mr. Clark, PA-C and agree.  

## 2014-11-01 NOTE — Progress Notes (Signed)
IDENTIFYING INFORMATION  Jerry Schmidt / DOB: 03/15/1981 / MRN: [id number]  The patient  does not have any active problems on file.  SUBJECTIVE  CC: Sore Throat   HPI: Jerry Schmidt is a 33 y.o. y.o. male presenting with 4 days of sore throat.  He reports cold sweats, fever, discomfort, insomnia on Saturday night, but felt better on Sunday.  The sore throat however has persisted.  He can eat and drink without difficulty.  He reports that he has been having ear pain that seems secondary to his sore throat.  He denies change in hearing.  He denies any exposure to strep throat.  He has been taking Advil.  He declines the flu shot today.    He  has a past medical history of Pollen allergies; Seasonal allergies; and Back injury (02/25/2014).    He has a current medication list which includes the following prescription(s): None.  Jerry Schmidt has No Known Allergies. He  reports that he has never smoked. He does not have any smokeless tobacco history on file. He reports that he drinks alcohol. He  has no sexual activity history on file.  The patient  has past surgical history that includes Hand surgery (Left, 2002).  His family history is negative.     Review of Systems  Constitutional: Negative.   HENT: Positive for sore throat. Negative for congestion.   Skin: Negative.   Neurological: Negative for headaches.    OBJECTIVE  Blood pressure 118/70, pulse 88, temperature 98.4 F (36.9 C), temperature source Oral, resp. rate 18, height 5\' 10"  (1.778 m), weight 239 lb 12.8 oz (108.773 kg), SpO2 99 %. The patient's body mass index is 34.41 kg/(m^2).  Physical Exam  Constitutional: He is oriented to person, place, and time. He appears well-developed and well-nourished. No distress.  HENT:  Head: Normocephalic.  Right Ear: Hearing, tympanic membrane and external ear normal. No drainage, swelling or tenderness. No decreased hearing is noted.  Left Ear: Hearing, tympanic membrane and external ear  normal. No drainage, swelling or tenderness. No decreased hearing is noted.  Nose: Nose normal. Right sinus exhibits no maxillary sinus tenderness and no frontal sinus tenderness. Left sinus exhibits no maxillary sinus tenderness and no frontal sinus tenderness.  Mouth/Throat: Uvula is midline, oropharynx is clear and moist and mucous membranes are normal. Mucous membranes are not pale, not dry and not cyanotic. No oropharyngeal exudate, posterior oropharyngeal edema, posterior oropharyngeal erythema or tonsillar abscesses.    Cardiovascular: Normal rate.   Respiratory: Effort normal.  GI: He exhibits no distension.  Musculoskeletal: Normal range of motion.  Neurological: He is alert and oriented to person, place, and time. He has normal reflexes.  Skin: Skin is warm and dry.  Psychiatric: He has a normal mood and affect. His behavior is normal. Judgment and thought content normal.    Results for orders placed or performed in visit on 11/01/14 (from the past 24 hour(s))  POCT rapid strep A     Status: Abnormal   Collection Time: 11/01/14  5:20 PM  Result Value Ref Range   Rapid Strep A Screen Positive (A) Negative    ASSESSMENT & PLAN  Jerry Schmidt was seen today for sore throat.  Diagnoses and associated orders for this visit:  Sore throat - POCT rapid strep A - ibuprofen (ADVIL,MOTRIN) 800 MG tablet; Take 1 tab every eight hours for pain - acetaminophen (TYLENOL) 500 MG tablet; Take 2 tablets (1,000 mg total) by mouth every 8 (eight)  hours as needed for moderate pain.  Streptococcal sore throat - amoxicillin (AMOXIL) 875 MG tablet; Take 1 tablet (875 mg total) by mouth 2 (two) times daily.     The patient was instructed to to call or comeback to clinic as needed, or should symptoms warrant.  Deliah BostonMichael Clark, MHS, PA-C [facility] 11/01/2014 5:30 PM

## 2015-03-08 ENCOUNTER — Emergency Department (HOSPITAL_COMMUNITY)
Admission: EM | Admit: 2015-03-08 | Discharge: 2015-03-08 | Disposition: A | Discharge status: Home / Self Care | Payer: 59 | Attending: Emergency Medicine | Admitting: Emergency Medicine

## 2015-03-08 ENCOUNTER — Encounter (HOSPITAL_COMMUNITY): Payer: Self-pay | Admitting: Emergency Medicine

## 2015-03-08 ENCOUNTER — Emergency Department (HOSPITAL_COMMUNITY): Payer: 59

## 2015-03-08 ENCOUNTER — Emergency Department (HOSPITAL_COMMUNITY)
Admission: EM | Admit: 2015-03-08 | Discharge: 2015-03-08 | Disposition: A | Discharge status: Home / Self Care | Payer: 59 | Source: Home / Self Care | Attending: Family Medicine | Admitting: Family Medicine

## 2015-03-08 DIAGNOSIS — R109 Unspecified abdominal pain: Secondary | ICD-10-CM

## 2015-03-08 DIAGNOSIS — N200 Calculus of kidney: Secondary | ICD-10-CM

## 2015-03-08 DIAGNOSIS — R1031 Right lower quadrant pain: Secondary | ICD-10-CM

## 2015-03-08 DIAGNOSIS — Z87828 Personal history of other (healed) physical injury and trauma: Secondary | ICD-10-CM | POA: Insufficient documentation

## 2015-03-08 LAB — CBC WITH DIFFERENTIAL/PLATELET
BASOS ABS: 0 10*3/uL (ref 0.0–0.1)
Basophils Relative: 0 % (ref 0–1)
EOS ABS: 0.1 10*3/uL (ref 0.0–0.7)
EOS PCT: 2 % (ref 0–5)
HCT: 42.9 % (ref 39.0–52.0)
Hemoglobin: 14.8 g/dL (ref 13.0–17.0)
LYMPHS ABS: 1 10*3/uL (ref 0.7–4.0)
Lymphocytes Relative: 12 % (ref 12–46)
MCH: 27.9 pg (ref 26.0–34.0)
MCHC: 34.5 g/dL (ref 30.0–36.0)
MCV: 80.8 fL (ref 78.0–100.0)
Monocytes Absolute: 0.4 10*3/uL (ref 0.1–1.0)
Monocytes Relative: 5 % (ref 3–12)
Neutro Abs: 6.6 10*3/uL (ref 1.7–7.7)
Neutrophils Relative %: 81 % — ABNORMAL HIGH (ref 43–77)
PLATELETS: 134 10*3/uL — AB (ref 150–400)
RBC: 5.31 MIL/uL (ref 4.22–5.81)
RDW: 12.9 % (ref 11.5–15.5)
WBC: 8.1 10*3/uL (ref 4.0–10.5)

## 2015-03-08 LAB — URINALYSIS, ROUTINE W REFLEX MICROSCOPIC
Glucose, UA: NEGATIVE mg/dL
KETONES UR: 15 mg/dL — AB
Leukocytes, UA: NEGATIVE
NITRITE: NEGATIVE
Protein, ur: 30 mg/dL — AB
Specific Gravity, Urine: 1.027 (ref 1.005–1.030)
UROBILINOGEN UA: 0.2 mg/dL (ref 0.0–1.0)
pH: 5 (ref 5.0–8.0)

## 2015-03-08 LAB — BASIC METABOLIC PANEL
ANION GAP: 8 (ref 5–15)
BUN: 16 mg/dL (ref 6–23)
CALCIUM: 9.3 mg/dL (ref 8.4–10.5)
CO2: 26 mmol/L (ref 19–32)
CREATININE: 1.29 mg/dL (ref 0.50–1.35)
Chloride: 107 mmol/L (ref 96–112)
GFR calc Af Amer: 84 mL/min — ABNORMAL LOW (ref >90)
GFR, EST NON AFRICAN AMERICAN: 72 mL/min — AB (ref >90)
GLUCOSE: 118 mg/dL — AB (ref 70–99)
Potassium: 4.3 mmol/L (ref 3.5–5.1)
Sodium: 141 mmol/L (ref 135–145)

## 2015-03-08 LAB — URINE MICROSCOPIC-ADD ON

## 2015-03-08 MED ORDER — HYDROCODONE-ACETAMINOPHEN 5-325 MG PO TABS
1.0000 | ORAL_TABLET | Freq: Once | ORAL | Status: AC
  Administered 2015-03-08: 1 via ORAL
  Filled 2015-03-08: qty 1

## 2015-03-08 MED ORDER — ONDANSETRON 4 MG PO TBDP
4.0000 mg | ORAL_TABLET | Freq: Once | ORAL | Status: AC
  Administered 2015-03-08: 4 mg via ORAL

## 2015-03-08 MED ORDER — ONDANSETRON 4 MG PO TBDP: 4.0000 mg | ORAL_TABLET | Freq: Three times a day (TID) | ORAL | Status: DC | PRN

## 2015-03-08 MED ORDER — ONDANSETRON 4 MG PO TBDP
ORAL_TABLET | ORAL | Status: AC
  Filled 2015-03-08: qty 1

## 2015-03-08 MED ORDER — SODIUM CHLORIDE 0.9 % IV BOLUS (SEPSIS)
1000.0000 mL | Freq: Once | INTRAVENOUS | Status: AC
  Administered 2015-03-08: 1000 mL via INTRAVENOUS

## 2015-03-08 MED ORDER — HYDROCODONE-ACETAMINOPHEN 5-325 MG PO TABS: 1.0000 | ORAL_TABLET | Freq: Four times a day (QID) | ORAL | Status: DC | PRN

## 2015-03-08 MED ORDER — KETOROLAC TROMETHAMINE 60 MG/2ML IM SOLN
60.0000 mg | Freq: Once | INTRAMUSCULAR | Status: AC
  Administered 2015-03-08: 60 mg via INTRAMUSCULAR

## 2015-03-08 MED ORDER — KETOROLAC TROMETHAMINE 60 MG/2ML IM SOLN
INTRAMUSCULAR | Status: AC
  Filled 2015-03-08: qty 2

## 2015-03-08 NOTE — ED Provider Notes (Signed)
CSN: [id number]     Arrival date & time 03/08/15  4098 History   First MD Initiated Contact with Patient 03/08/15 0830     Chief Complaint  Patient presents with  . Back Pain   (Consider location/radiation/quality/duration/timing/severity/associated sxs/prior Treatment) HPI Comments: Abrupt onset of right flank and RLQ abdominal pain while driving to work this morning. Pain associated with diaphoresis, nausea and vomiting. No previous episode or hx of ureterolithiasis. No previous abdominal surgeries. Does not smoke or drink ETOH. Works in food service delivery  Patient is a 34 y.o. male presenting with abdominal pain. The history is provided by the patient.  Abdominal Pain Pain location:  R flank Pain quality: aching and sharp   Pain radiates to:  RLQ and scrotum Pain severity:  Moderate Onset quality:  Sudden Duration:  1 hour Timing:  Constant Progression:  Worsening Chronicity:  New Worsened by:  Movement Associated symptoms: chills, diarrhea, nausea and vomiting   Associated symptoms: no constipation, no cough, no dysuria, no fever, no hematemesis, no hematochezia, no hematuria, no melena and no shortness of breath   Associated symptoms comment:  Has had several days of diarrhea   Past Medical History  Diagnosis Date  . Pollen allergies   . Seasonal allergies   . Back injury 02/25/2014   Past Surgical History  Procedure Laterality Date  . 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 History  Substance Use Topics  . Smoking status: Never Smoker   . Smokeless tobacco: Not on file  . Alcohol Use: Yes    Review of Systems  Constitutional: Positive for chills and appetite change. Negative for fever.  HENT: Negative.   Eyes: Negative.   Respiratory: Negative for cough, chest tightness, shortness of breath and wheezing.   Cardiovascular: Negative.   Gastrointestinal: Positive for nausea, vomiting, abdominal pain  and diarrhea. Negative for constipation, melena, hematochezia and hematemesis.  Genitourinary: Positive for flank pain. Negative for dysuria, urgency, hematuria, decreased urine volume, discharge, penile swelling and penile pain.       States he has aching sensation in scrotal area  Musculoskeletal: Positive for back pain.  Skin: Negative.   Neurological: Negative for dizziness and light-headedness.    Allergies  Review of patient's allergies indicates no known allergies.  Home Medications   Prior to Admission medications   Medication Sig Start Date End Date Taking? Authorizing Provider  acetaminophen (TYLENOL) 500 MG tablet Take 2 tablets (1,000 mg total) by mouth every 8 (eight) hours as needed for moderate pain. Take 2 tabs every 8 hours. 11/01/14   Ofilia Neas, PA-C  amoxicillin (AMOXIL) 875 MG tablet Take 1 tablet (875 mg total) by mouth 2 (two) times daily. Patient not taking: Reported on 03/08/2015 11/01/14   Ofilia Neas, PA-C  ibuprofen (ADVIL,MOTRIN) 800 MG tablet Take 1 tab every eight hours for pain 11/01/14   Ofilia Neas, PA-C   BP 139/94 mmHg  Pulse 65  Temp(Src) 97.8 F (36.6 C) (Oral)  Resp 16  SpO2 96% Physical Exam  Constitutional: He is oriented to person, place, and time. He appears well-developed and well-nourished.  +appears uncomfortable  HENT:  Head: Normocephalic and atraumatic.  Eyes: Conjunctivae are normal. No scleral icterus.  Cardiovascular: Normal rate, regular rhythm and normal heart sounds.   Pulmonary/Chest: Effort normal and breath sounds normal. No respiratory distress. He has no wheezes.  Abdominal: Soft. Normal appearance. He exhibits  no distension and no mass. Bowel sounds are decreased. There is tenderness in the right upper quadrant. There is CVA tenderness. There is no rigidity, no rebound and no guarding.  + R CVAT  Musculoskeletal: Normal range of motion.  Neurological: He is alert and oriented to person, place, and time.  Skin:  Skin is warm. He is diaphoretic. No pallor.  Psychiatric: He has a normal mood and affect. His behavior is normal.  Nursing note and vitals reviewed.   ED Course  Procedures (including critical care time) Labs Review Labs Reviewed - No data to display  Imaging Review No results found.   MDM   1. Right lower quadrant abdominal pain   2. Flank pain   [facility] Provided patient with 4 mg ODT zofran and 60mg  [facility] 34 [facility] with abrupt onset right flank and RLQ abdominal pain with associated nausea, vomiting, diarrhea and diaphoresis. Hx suggestive of right ureterolithiasis, however, patient has marked tenderness of RLQ of abdomen. [facility] and possible imaging.    Ria ClockJennifer Lee H Jasneet Schobert, [place] 03/08/15 [phone]

## 2015-03-08 NOTE — ED Notes (Signed)
I gave the patient a urine strainer.

## 2015-03-08 NOTE — ED Provider Notes (Signed)
CSN: [id number]     Arrival date & time 03/08/15  6962 History   First MD Initiated Contact with Patient 03/08/15 1019     Chief Complaint  Patient presents with  . Flank Pain     (Consider location/radiation/quality/duration/timing/severity/associated sxs/prior Treatment) HPI Comments: Abrupt onset of right flank and RLQ abdominal pain while driving to work this morning. Pain associated with diaphoresis, nausea and vomiting. No previous episode or hx of ureterolithiasis. No previous abdominal surgeries. Does not smoke or drink ETOH. No abdominal surgical history.   Patient is a 34 y.o. male presenting with flank pain. The history is provided by the patient.  Flank Pain This is a new problem. The current episode started today. The problem occurs constantly. The problem has been unchanged. Associated symptoms include abdominal pain (RLQ), chills, diaphoresis, nausea and vomiting. Nothing aggravates the symptoms. He has tried NSAIDs (Toradol at [facility]) for the symptoms. The treatment provided moderate relief.    Past Medical History  Diagnosis Date  . Pollen allergies   . Seasonal allergies   . Back injury 02/25/2014   Past Surgical History  Procedure Laterality Date  . 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 No pertinent family history. History  Substance Use Topics  . Smoking status: Never Smoker   . Smokeless tobacco: Not on file  . Alcohol Use: Yes    Review of Systems  Constitutional: Positive for chills and diaphoresis.  Gastrointestinal: Positive for nausea, vomiting and abdominal pain (RLQ).  Genitourinary: Positive for flank pain.  All other systems reviewed and are negative.     Allergies  Review of patient's allergies indicates no known allergies.  Home Medications   Prior to Admission medications   Medication Sig Start Date End Date Taking? Authorizing Provider  acetaminophen (TYLENOL) 500 MG tablet Take 2  tablets (1,000 mg total) by mouth every 8 (eight) hours as needed for moderate pain. Take 2 tabs every 8 hours. 11/01/14   Ofilia Neas, PA-C  amoxicillin (AMOXIL) 875 MG tablet Take 1 tablet (875 mg total) by mouth 2 (two) times daily. Patient not taking: Reported on 03/08/2015 11/01/14   Ofilia Neas, PA-C  HYDROcodone-acetaminophen (NORCO/VICODIN) 5-325 MG per tablet Take 1-2 tablets by mouth every 6 (six) hours as needed for severe pain. 03/08/15   Francee Piccolo, PA-C  ibuprofen (ADVIL,MOTRIN) 800 MG tablet Take 1 tab every eight hours for pain 11/01/14   Ofilia Neas, PA-C  ondansetron (ZOFRAN ODT) 4 MG disintegrating tablet Take 1 tablet (4 mg total) by mouth every 8 (eight) hours as needed for nausea or vomiting. 03/08/15   Victorino Dike Ferdinando Lodge, PA-C   BP 103/60 mmHg  Pulse 72  Temp(Src) 98 F (36.7 C) (Oral)  Resp 18  Ht  (1.803 m)  Wt 240 lb (108.863 kg)  BMI 33.49 kg/m2  SpO2 97% Physical Exam  Constitutional: He is oriented to person, place, and time. He appears well-developed and well-nourished. No distress.  HENT:  Head: Normocephalic and atraumatic.  Right Ear: External ear normal.  Left Ear: External ear normal.  Nose: Nose normal.  Mouth/Throat: Oropharynx is clear and moist.  Eyes: Conjunctivae are normal.  Neck: Neck supple.  Cardiovascular: Normal rate, regular rhythm and normal heart sounds.   Pulmonary/Chest: Effort normal and breath sounds normal.  Abdominal: Soft. Bowel sounds are normal. There is tenderness (mild). There is CVA tenderness (R).    Musculoskeletal:  Back:  Neurological: He is alert and oriented to person, place, and time.  Moves all extremities without ataxia.   Skin: Skin is warm and dry. He is not diaphoretic.  Nursing note and vitals reviewed.   ED Course  Procedures (including critical care time) Medications  sodium chloride 0.9 % bolus 1,000 mL (0 mLs Intravenous Stopped 03/08/15 1303)    HYDROcodone-acetaminophen (NORCO/VICODIN) 5-325 MG per tablet 1 tablet (1 tablet Oral Given 03/08/15 1300)    Labs Review Labs Reviewed  BASIC METABOLIC PANEL - Abnormal; Notable for the following:    Glucose, Bld 118 (*)    GFR calc non Af Amer 72 (*)    GFR calc Af Amer 84 (*)    All other components within normal limits  CBC WITH DIFFERENTIAL/PLATELET - Abnormal; Notable for the following:    Platelets 134 (*)    Neutrophils Relative % 81 (*)    All other components within normal limits  URINALYSIS, ROUTINE W REFLEX MICROSCOPIC - Abnormal; Notable for the following:    APPearance CLOUDY (*)    Hgb urine dipstick LARGE (*)    Bilirubin Urine SMALL (*)    Ketones, ur 15 (*)    Protein, ur 30 (*)    All other components within normal limits  URINE CULTURE  URINE MICROSCOPIC-ADD ON    Imaging Review Ct Abdomen Pelvis Wo Contrast  03/08/2015   CLINICAL DATA:  Acute right flank pain.  EXAM: CT ABDOMEN AND PELVIS WITHOUT CONTRAST  TECHNIQUE: Multidetector CT imaging of the abdomen and pelvis was performed following the standard protocol without IV contrast.  COMPARISON:  None.  FINDINGS: Visualized lung bases appear normal. No significant osseous abnormality is noted.  No gallstones are noted. Probable fatty infiltration of the liver. No focal abnormality is noted in the liver, spleen or pancreas. Adrenal glands appear normal. Left kidney and ureter appear normal. Minimal right hydronephrosis is noted secondary to 3 mm calculus in proximal right ureter. There is no evidence of bowel obstruction. Appendix appears normal. No abnormal fluid collection is noted. Urinary bladder appears normal. No significant adenopathy is noted.  IMPRESSION: Probable fatty infiltration of the liver. Minimal right hydronephrosis secondary to 3 mm proximal right ureteral calculus.   Electronically Signed   By: Lupita RaiderJames  Green Jr, M.D.   On: 03/08/2015 12:31     EKG Interpretation None      MDM   Final  diagnoses:  Flank pain  Kidney stone    Filed Vitals:   03/08/15 1245  BP: 103/60  Pulse: 72  Temp:   Resp: 18   Afebrile, NAD, non-toxic appearing, AAOx4.  I have reviewed nursing notes, vital signs, and all appropriate lab and imaging results for this patient. Pt has been diagnosed with a Kidney Stone via CT. There is no evidence of significant hydronephrosis, serum creatine WNL, vitals sign stable and the pt does not have irratractable vomiting. Pt will be dc home with pain medications & has been advised to follow up with PCP.  Patient is stable at time of discharge      Francee PiccoloJennifer Aliene Tamura, PA-C 03/08/15 1339  Azalia BilisKevin Campos, MD 03/08/15 1737

## 2015-03-08 NOTE — ED Notes (Signed)
Sharp back pain, onset today.

## 2015-03-08 NOTE — Discharge Instructions (Signed)
Please follow up with your primary care physician in 1-2 days. If you do not have one please call the [facility] number listed above. Please take pain medication and/or muscle relaxants as prescribed and as needed for pain. Please do not drive on narcotic pain medication or on muscle relaxants. Please read all discharge instructions and return precautions.  ° °Kidney Stones °Kidney stones (urolithiasis) are deposits that form inside your kidneys. The intense pain is caused by the stone moving through the urinary tract. When the stone moves, the ureter goes into spasm around the stone. The stone is usually passed in the urine.  °CAUSES  °· A disorder that makes certain neck glands produce too much parathyroid hormone (primary hyperparathyroidism). °· A buildup of uric acid crystals, similar to gout in your joints. °· Narrowing (stricture) of the ureter. °· A kidney obstruction present at birth (congenital obstruction). °· Previous surgery on the kidney or ureters. °· Numerous kidney infections. °SYMPTOMS  °· Feeling sick to your stomach (nauseous). °· Throwing up (vomiting). °· Blood in the urine (hematuria). °· Pain that usually spreads (radiates) to the groin. °· Frequency or urgency of urination. °DIAGNOSIS  °· Taking a history and physical exam. °· Blood or urine tests. °· CT scan. °· Occasionally, an examination of the inside of the urinary bladder (cystoscopy) is performed. °TREATMENT  °· Observation. °· Increasing your fluid intake. °· Extracorporeal shock wave lithotripsy--This is a noninvasive procedure that uses shock waves to break up kidney stones. °· Surgery may be needed if you have severe pain or persistent obstruction. There are various surgical procedures. Most of the procedures are performed with the use of small instruments. Only small incisions are needed to accommodate these instruments, so recovery time is minimized. °The size, location, and chemical composition are all  important variables that will determine the proper choice of action for you. Talk to your health care provider to better understand your situation so that you will minimize the risk of injury to yourself and your kidney.  °HOME CARE INSTRUCTIONS  °· Drink enough water and fluids to keep your urine clear or pale yellow. This will help you to pass the stone or stone fragments. °· Strain all urine through the provided strainer. Keep all particulate matter and stones for your health care provider to see. The stone causing the pain may be as small as a grain of salt. It is very important to use the strainer each and every time you pass your urine. The collection of your stone will allow your health care provider to analyze it and verify that a stone has actually passed. The stone analysis will often identify what you can do to reduce the incidence of recurrences. °· Only take over-the-counter or prescription medicines for pain, discomfort, or fever as directed by your health care provider. °· Make a follow-up appointment with your health care provider as directed. °· Get follow-up X-rays if required. The absence of pain does not always mean that the stone has passed. It may have only stopped moving. If the urine remains completely obstructed, it can cause loss of kidney function or even complete destruction of the kidney. It is your responsibility to make sure X-rays and follow-ups are completed. Ultrasounds of the kidney can show blockages and the status of the kidney. Ultrasounds are not associated with any radiation and can be performed easily in a matter of minutes. °SEEK MEDICAL CARE IF: °· You experience pain that is progressive and unresponsive to   any pain medicine you have been prescribed. °SEEK IMMEDIATE MEDICAL CARE IF:  °· Pain cannot be controlled with the prescribed medicine. °· You have a fever or shaking chills. °· The severity or intensity of pain increases over 18 hours and is not relieved by pain  medicine. °· You develop a new onset of abdominal pain. °· You feel faint or pass out. °· You are unable to urinate. °MAKE SURE YOU:  °· Understand these instructions. °· Will watch your condition. °· Will get help right away if you are not doing well or get worse. °Document Released: 11/12/2005 Document Revised: 07/15/2013 Document Reviewed: 04/15/2013 °[employer]® Patient Information ©2015 [employer]. This information is not intended to replace advice given to you by your health care provider. Make sure you discuss any questions you have with your health care provider. ° ° ° °

## 2015-03-08 NOTE — ED Notes (Addendum)
R sided flank pain came on suddenly and vomiting; no hx of stones; has not been able to urinate. Was sent from urgent. Was given shot at [facility] for pain.

## 2015-03-08 NOTE — ED Notes (Signed)
Patient is unable to void at this time 

## 2015-03-09 LAB — URINE CULTURE
COLONY COUNT: NO GROWTH
CULTURE: NO GROWTH

## 2015-04-29 ENCOUNTER — Encounter (HOSPITAL_COMMUNITY): Payer: Self-pay | Admitting: *Deleted

## 2015-04-29 ENCOUNTER — Emergency Department (HOSPITAL_COMMUNITY): Payer: 59

## 2015-04-29 ENCOUNTER — Emergency Department (HOSPITAL_COMMUNITY)
Admission: EM | Admit: 2015-04-29 | Discharge: 2015-04-29 | Disposition: A | Discharge status: Home / Self Care | Payer: 59 | Attending: Emergency Medicine | Admitting: Emergency Medicine

## 2015-04-29 DIAGNOSIS — Z87828 Personal history of other (healed) physical injury and trauma: Secondary | ICD-10-CM | POA: Diagnosis not present

## 2015-04-29 DIAGNOSIS — N23 Unspecified renal colic: Secondary | ICD-10-CM | POA: Insufficient documentation

## 2015-04-29 DIAGNOSIS — M545 Low back pain: Secondary | ICD-10-CM | POA: Insufficient documentation

## 2015-04-29 DIAGNOSIS — Z79899 Other long term (current) drug therapy: Secondary | ICD-10-CM | POA: Diagnosis not present

## 2015-04-29 DIAGNOSIS — R109 Unspecified abdominal pain: Secondary | ICD-10-CM

## 2015-04-29 LAB — URINALYSIS, ROUTINE W REFLEX MICROSCOPIC
Bilirubin Urine: NEGATIVE
Glucose, UA: NEGATIVE mg/dL
Ketones, ur: NEGATIVE mg/dL
Leukocytes, UA: NEGATIVE
NITRITE: NEGATIVE
PH: 5.5 (ref 5.0–8.0)
PROTEIN: NEGATIVE mg/dL
Specific Gravity, Urine: 1.026 (ref 1.005–1.030)
UROBILINOGEN UA: 0.2 mg/dL (ref 0.0–1.0)

## 2015-04-29 LAB — I-STAT CHEM 8, ED
BUN: 23 mg/dL — AB (ref 6–20)
CALCIUM ION: 1.18 mmol/L (ref 1.12–1.23)
CREATININE: 1.3 mg/dL — AB (ref 0.61–1.24)
Chloride: 103 mmol/L (ref 101–111)
GLUCOSE: 116 mg/dL — AB (ref 65–99)
HCT: 42 % (ref 39.0–52.0)
Hemoglobin: 14.3 g/dL (ref 13.0–17.0)
Potassium: 4.3 mmol/L (ref 3.5–5.1)
Sodium: 142 mmol/L (ref 135–145)
TCO2: 26 mmol/L (ref 0–100)

## 2015-04-29 LAB — URINE MICROSCOPIC-ADD ON

## 2015-04-29 MED ORDER — HYDROMORPHONE HCL 1 MG/ML IJ SOLN
1.0000 mg | Freq: Once | INTRAMUSCULAR | Status: AC
  Administered 2015-04-29: 1 mg via INTRAVENOUS
  Filled 2015-04-29: qty 1

## 2015-04-29 MED ORDER — OXYCODONE-ACETAMINOPHEN 5-325 MG PO TABS: 1.0000 | ORAL_TABLET | ORAL | Status: DC | PRN

## 2015-04-29 MED ORDER — TAMSULOSIN HCL 0.4 MG PO CAPS: 0.4000 mg | ORAL_CAPSULE | Freq: Every day | ORAL | Status: DC

## 2015-04-29 MED ORDER — ONDANSETRON 4 MG PO TBDP: 4.0000 mg | ORAL_TABLET | Freq: Three times a day (TID) | ORAL | Status: DC | PRN

## 2015-04-29 MED ORDER — KETOROLAC TROMETHAMINE 30 MG/ML IJ SOLN
30.0000 mg | Freq: Once | INTRAMUSCULAR | Status: AC
  Administered 2015-04-29: 30 mg via INTRAVENOUS
  Filled 2015-04-29: qty 1

## 2015-04-29 MED ORDER — KETOROLAC TROMETHAMINE 10 MG PO TABS: 10.0000 mg | ORAL_TABLET | Freq: Four times a day (QID) | ORAL | Status: DC | PRN

## 2015-04-29 MED ORDER — ONDANSETRON HCL 4 MG/2ML IJ SOLN
4.0000 mg | Freq: Once | INTRAMUSCULAR | Status: AC
  Administered 2015-04-29: 4 mg via INTRAVENOUS
  Filled 2015-04-29: qty 2

## 2015-04-29 NOTE — ED Notes (Signed)
Patient transported to CT 

## 2015-04-29 NOTE — Discharge Instructions (Signed)

## 2015-04-29 NOTE — ED Notes (Addendum)
Pt c/o rt side flank pain since 0030. Also c/o vomit x 1. States that the pain is similar to his last kidney stone that he does not think he passed. C/o pain with urination.

## 2015-04-29 NOTE — ED Provider Notes (Signed)
CSN: [id number]     Arrival date & time 04/29/15  0245 History  This chart was scribed for Loren Racer, MD by Freida Busman, ED Scribe. This patient was seen in room D32C/D32C and the patient's care was started 3:50 AM.    Chief Complaint  Patient presents with  . Flank Pain    The history is provided by the patient. No language interpreter was used.     HPI Comments:  Jerry Schmidt is a 34 y.o. male who presents to the Emergency Department complaining of 9/10 constant right sided flank pain that woke him from his sleep about 0030 this am. His pain radiates to his right abdomen. He reports associated nausea, vomiting and mild urinary discomfort.  Pt has a history of a 3 mm right sided kidney stone diagnosed in April 2016, his pain today is similar. He does not believe he ever passed that stone and did not follow up with urology; states upon being discharged his pain had resolved. No alleviating factors noted. Denies hematuria, frequency or urgency. No fever or chills. No scrotal masses or pain.  Past Medical History  Diagnosis Date  . Pollen allergies   . Seasonal allergies   . Back injury 02/25/2014   Past Surgical History  Procedure Laterality Date  . 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 History  Substance Use Topics  . Smoking status: Never Smoker   . Smokeless tobacco: Not on file  . Alcohol Use: Yes    Review of Systems  Constitutional: Negative for fever and chills.  Respiratory: Negative for cough and shortness of breath.   Cardiovascular: Negative for chest pain.  Gastrointestinal: Positive for nausea, vomiting and abdominal pain. Negative for diarrhea and constipation.  Genitourinary: Positive for dysuria and flank pain. Negative for hematuria, scrotal swelling, difficulty urinating, penile pain and testicular pain.  Musculoskeletal: Positive for back pain. Negative for neck pain and neck stiffness.   Skin: Negative for rash and wound.  Neurological: Negative for dizziness, weakness, light-headedness, numbness and headaches.  All other systems reviewed and are negative.     Allergies  Review of patient's allergies indicates no known allergies.  Home Medications   Prior to Admission medications   Medication Sig Start Date End Date Taking? Authorizing Provider  acetaminophen (TYLENOL) 500 MG tablet Take 2 tablets (1,000 mg total) by mouth every 8 (eight) hours as needed for moderate pain. Take 2 tabs every 8 hours. 11/01/14  Yes Ofilia Neas, PA-C  cetirizine (ZYRTEC) 10 MG tablet Take 10 mg by mouth daily.   Yes Historical Provider, MD  Multiple Vitamins-Minerals (MULTIVITAMIN WITH MINERALS) tablet Take 1 tablet by mouth daily.   Yes Historical Provider, MD  amoxicillin (AMOXIL) 875 MG tablet Take 1 tablet (875 mg total) by mouth 2 (two) times daily. Patient not taking: Reported on 03/08/2015 11/01/14   Ofilia Neas, PA-C  ketorolac (TORADOL) 10 MG tablet Take 1 tablet (10 mg total) by mouth every 6 (six) hours as needed. 04/29/15   Loren Racer, MD  ondansetron (ZOFRAN ODT) 4 MG disintegrating tablet Take 1 tablet (4 mg total) by mouth every 8 (eight) hours as needed for nausea or vomiting. 04/29/15   Loren Racer, MD  oxyCODONE-acetaminophen (PERCOCET) 5-325 MG per tablet Take 1-2 tablets by mouth every 4 (four) hours as needed. 04/29/15   Loren Racer, MD  tamsulosin (FLOMAX) 0.4 MG CAPS capsule Take 1  capsule (0.4 mg total) by mouth daily. 04/29/15   Loren Racer, MD   BP 121/62 mmHg  Pulse 88  Temp(Src) 97.8 F (36.6 C) (Oral)  Resp 20  Ht  (1.803 m)  Wt 240 lb (108.863 kg)  BMI 33.49 kg/m2  SpO2 97% Physical Exam  Constitutional: He is oriented to person, place, and time. He appears well-developed and well-nourished. No distress.  HENT:  Head: Normocephalic and atraumatic.  Mouth/Throat: Oropharynx is clear and moist.  Eyes: EOM are normal. Pupils are  equal, round, and reactive to light.  Neck: Normal range of motion. Neck supple.  Cardiovascular: Normal rate and regular rhythm.   Pulmonary/Chest: Effort normal and breath sounds normal. No respiratory distress. He has no wheezes. He has no rales. He exhibits no tenderness.  Abdominal: Soft. Bowel sounds are normal. He exhibits no distension and no mass. There is tenderness (mild right-sided abdominal tenderness without rebound or guarding.). There is no rebound and no guarding.  Musculoskeletal: Normal range of motion. He exhibits no edema or tenderness.  Mild right CVA tenderness with percussion.  Neurological: He is alert and oriented to person, place, and time.  5/5 motor in all extremities. Sensation is fully intact.  Skin: Skin is warm and dry. No rash noted. No erythema.  Psychiatric: He has a normal mood and affect. His behavior is normal.  Nursing note and vitals reviewed.   ED Course  Procedures   DIAGNOSTIC STUDIES:  Oxygen Saturation is 96% on RA, normal by my interpretation.    COORDINATION OF CARE:  3:54 AM Will order zofran and pain meds. Discussed treatment plan with pt at bedside and pt agreed to plan.  Labs Review Labs Reviewed  URINALYSIS, ROUTINE W REFLEX MICROSCOPIC (NOT AT [facility]) - Abnormal; Notable for the following:    Hgb urine dipstick SMALL (*)    All other components within normal limits  I-STAT CHEM 8, ED - Abnormal; Notable for the following:    BUN 23 (*)    Creatinine, Ser 1.30 (*)    Glucose, Bld 116 (*)    All other components within normal limits  URINE MICROSCOPIC-ADD ON    Imaging Review Ct Abdomen Pelvis Wo Contrast  04/29/2015   CLINICAL DATA:  Sudden onset of right lower quadrant and right flank pain.  EXAM: CT ABDOMEN AND PELVIS WITHOUT CONTRAST  TECHNIQUE: Multidetector CT imaging of the abdomen and pelvis was performed following the standard protocol without IV contrast.  COMPARISON:  CT 03/08/2015  FINDINGS: The included lung bases  are clear.  There is a 3 mm obstructing stone in the right distal ureter at the ureterovesicular junction with mild resultant hydroureteronephrosis and perinephric stranding. Probable punctate nonobstructing stone in the mid upper left kidney. There is no left obstructive uropathy.  Evaluation of the remaining solid and hollow viscera is limited given lack of contrast. Suspect mild hepatic steatosis. No focal hepatic lesion. Spleen at the upper limits of normal in size without localizing abnormality. Splenule at the splenic hilum. The unenhanced pancreas, gallbladder, and adrenal glands are normal.  Stomach is decompressed. There are no dilated or thickened bowel loops. The appendix is normal. Small volume of colonic stool without colonic wall thickening. No free air, free fluid, or intra-abdominal fluid collection.  No retroperitoneal adenopathy. Abdominal aorta is normal in caliber.  Within the pelvis the urinary bladder is physiologically distended, no additional bladder stone. Prostate gland is normal in size. No pelvic free fluid or adenopathy.  There are no  acute or suspicious osseous abnormalities.  IMPRESSION: Obstructing 3 mm stone at the right ureterovesicular junction with mild resultant hydroureteronephrosis.   Electronically Signed   By: Rubye OaksMelanie  Ehinger M.D.   On: 04/29/2015 05:13     EKG Interpretation None      MDM   Final diagnoses:  Right flank pain  Renal colic on right side    I personally performed the services described in this documentation, which was scribed in my presence. The recorded information has been reviewed and is accurate.   Patient's pain is controlled in the emergency department. CT with right-sided 3 mm stone at UVJ. Advised to follow-up with urology and return precautions given.  Loren Raceravid Scout Guyett, MD 04/29/15 [phone]

## 2016-12-07 ENCOUNTER — Ambulatory Visit (INDEPENDENT_AMBULATORY_CARE_PROVIDER_SITE_OTHER): Payer: 59 | Admitting: Family Medicine

## 2016-12-07 DIAGNOSIS — Z0289 Encounter for other administrative examinations: Secondary | ICD-10-CM

## 2017-08-19 ENCOUNTER — Ambulatory Visit: Payer: 59 | Admitting: Family Medicine

## 2017-08-28 ENCOUNTER — Encounter (INDEPENDENT_AMBULATORY_CARE_PROVIDER_SITE_OTHER): Payer: Self-pay

## 2017-08-28 ENCOUNTER — Ambulatory Visit: Payer: 59 | Admitting: Family Medicine

## 2017-08-28 ENCOUNTER — Ambulatory Visit (INDEPENDENT_AMBULATORY_CARE_PROVIDER_SITE_OTHER): Payer: 59 | Admitting: Family Medicine

## 2017-08-28 ENCOUNTER — Encounter: Payer: Self-pay | Admitting: Family Medicine

## 2017-08-28 VITALS — BP 108/68 | HR 100 | Temp 98.1°F | Ht 70.0 in | Wt 247.0 lb

## 2017-08-28 DIAGNOSIS — R5382 Chronic fatigue, unspecified: Secondary | ICD-10-CM

## 2017-08-28 DIAGNOSIS — Z6835 Body mass index (BMI) 35.0-35.9, adult: Secondary | ICD-10-CM | POA: Insufficient documentation

## 2017-08-28 DIAGNOSIS — R61 Generalized hyperhidrosis: Secondary | ICD-10-CM | POA: Diagnosis not present

## 2017-08-28 DIAGNOSIS — R234 Changes in skin texture: Secondary | ICD-10-CM

## 2017-08-28 DIAGNOSIS — Z23 Encounter for immunization: Secondary | ICD-10-CM

## 2017-08-28 DIAGNOSIS — M79676 Pain in unspecified toe(s): Secondary | ICD-10-CM

## 2017-08-28 DIAGNOSIS — L608 Other nail disorders: Secondary | ICD-10-CM

## 2017-08-28 DIAGNOSIS — Z7689 Persons encountering health services in other specified circumstances: Secondary | ICD-10-CM | POA: Diagnosis not present

## 2017-08-28 NOTE — Patient Instructions (Signed)
Mediterranean Diet A Mediterranean diet refers to food and lifestyle choices that are based on the traditions of countries located on the [place]. This way of eating has been shown to help prevent certain conditions and improve outcomes for people who have chronic diseases, like kidney disease and heart disease. What are tips for following this plan? Lifestyle  Cook and eat meals together with your family, when possible.  Drink enough fluid to keep your urine clear or pale yellow.  Be physically active every day. This includes: ? Aerobic exercise like running or swimming. ? Leisure activities like gardening, walking, or housework.  Get 7-8 hours of sleep each night.  If recommended by your health care provider, drink red wine in moderation. This means 1 glass a day for nonpregnant women and 2 glasses a day for men. A glass of wine equals 5 oz (150 mL). Reading food labels  Check the serving size of packaged foods. For foods such as rice and pasta, the serving size refers to the amount of cooked product, not dry.  Check the total fat in packaged foods. Avoid foods that have saturated fat or trans fats.  Check the ingredients list for added sugars, such as corn syrup. Shopping  At the grocery store, buy most of your food from the areas near the walls of the store. This includes: ? Fresh fruits and vegetables (produce). ? Grains, beans, nuts, and seeds. Some of these may be available in unpackaged forms or large amounts (in bulk). ? Fresh seafood. ? Poultry and eggs. ? Low-fat dairy products.  Buy whole ingredients instead of prepackaged foods.  Buy fresh fruits and vegetables in-season from local farmers markets.  Buy frozen fruits and vegetables in resealable bags.  If you do not have access to quality fresh seafood, buy precooked frozen shrimp or canned fish, such as tuna, salmon, or sardines.  Buy small amounts of raw or cooked vegetables, salads, or olives from the  deli or salad bar at your store.  Stock your pantry so you always have certain foods on hand, such as olive oil, canned tuna, canned tomatoes, rice, pasta, and beans. Cooking  Cook foods with extra-virgin olive oil instead of using butter or other vegetable oils.  Have meat as a side dish, and have vegetables or grains as your main dish. This means having meat in small portions or adding small amounts of meat to foods like pasta or stew.  Use beans or vegetables instead of meat in common dishes like chili or lasagna.  Experiment with different cooking methods. Try roasting or broiling vegetables instead of steaming or sauteing them.  Add frozen vegetables to soups, stews, pasta, or rice.  Add nuts or seeds for added healthy fat at each meal. You can add these to yogurt, salads, or vegetable dishes.  Marinate fish or vegetables using olive oil, lemon juice, garlic, and fresh herbs. Meal planning  Plan to eat 1 vegetarian meal one day each week. Try to work up to 2 vegetarian meals, if possible.  Eat seafood 2 or more times a week.  Have healthy snacks readily available, such as: ? Vegetable sticks with hummus. ? Greek yogurt. ? Fruit and nut trail mix.  Eat balanced meals throughout the week. This includes: ? Fruit: 2-3 servings a day ? Vegetables: 4-5 servings a day ? Low-fat dairy: 2 servings a day ? Fish, poultry, or lean meat: 1 serving a day ? Beans and legumes: 2 or more servings a week ? Nuts   and seeds: 1-2 servings a day ? Whole grains: 6-8 servings a day ? Extra-virgin olive oil: 3-4 servings a day  Limit red meat and sweets to only a few servings a month What are my food choices?  Mediterranean diet ? Recommended ? Grains: Whole-grain pasta. Brown rice. Bulgar wheat. Polenta. Couscous. Whole-wheat bread. Oatmeal. Quinoa. ? Vegetables: Artichokes. Beets. Broccoli. Cabbage. Carrots. Eggplant. Green beans. Chard. Kale. Spinach. Onions. Leeks. Peas. Squash.  Tomatoes. Peppers. Radishes. ? Fruits: Apples. Apricots. Avocado. Berries. Bananas. Cherries. Dates. Figs. Grapes. Lemons. Melon. Oranges. Peaches. Plums. Pomegranate. ? Meats and other protein foods: Beans. Almonds. Sunflower seeds. Pine nuts. Peanuts. Cod. Salmon. Scallops. Shrimp. Tuna. Tilapia. Clams. Oysters. Eggs. ? Dairy: Low-fat milk. Cheese. Greek yogurt. ? Beverages: Water. Red wine. Herbal tea. ? Fats and oils: Extra virgin olive oil. Avocado oil. Grape seed oil. ? Sweets and desserts: Greek yogurt with honey. Baked apples. Poached pears. Trail mix. ? Seasoning and other foods: Basil. Cilantro. Coriander. Cumin. Mint. Parsley. Sage. Rosemary. Tarragon. Garlic. Oregano. Thyme. Pepper. Balsalmic vinegar. Tahini. Hummus. Tomato sauce. Olives. Mushrooms. ? Limit these ? Grains: Prepackaged pasta or rice dishes. Prepackaged cereal with added sugar. ? Vegetables: Deep fried potatoes (french fries). ? Fruits: Fruit canned in syrup. ? Meats and other protein foods: Beef. Pork. Lamb. Poultry with skin. Hot dogs. Bacon. ? Dairy: Ice cream. Sour cream. Whole milk. ? Beverages: Juice. Sugar-sweetened soft drinks. Beer. Liquor and spirits. ? Fats and oils: Butter. Canola oil. Vegetable oil. Beef fat (tallow). Lard. ? Sweets and desserts: Cookies. Cakes. Pies. Candy. ? Seasoning and other foods: Mayonnaise. Premade sauces and marinades. ? The items listed may not be a complete list. Talk with your dietitian about what dietary choices are right for you. Summary  The Mediterranean diet includes both food and lifestyle choices.  Eat a variety of fresh fruits and vegetables, beans, nuts, seeds, and whole grains.  Limit the amount of red meat and sweets that you eat.  Talk with your health care provider about whether it is safe for you to drink red wine in moderation. This means 1 glass a day for nonpregnant women and 2 glasses a day for men. A glass of wine equals 5 oz (150 mL). This information  is not intended to replace advice given to you by your health care provider. Make sure you discuss any questions you have with your health care provider. Document Released: 07/05/2016 Document Revised: 08/07/2016 Document Reviewed: 07/05/2016 Elsevier Interactive Patient Education  2018 Elsevier Inc.  

## 2017-08-28 NOTE — Progress Notes (Signed)
Subjective:    Patient ID: Jerry Schmidt, male    DOB: [birth year], 36 y.o.   MRN: [id number]  HPI This is a 36 yo male who presents today to establish care and with several complaints. He is married with 3 children (12, 6, 2 months). 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 He is currently looking for a new job. Works 7 days a week, about 35 hours a week. 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 Home life good.   Fatigued for several years but notices that it seems worse lately. He is self employed and stays busy coaching and sleeps around 5 hours a night. Has been noticing weight gain. Has been taking some of his wife's phentermine and has noticed some weight loss (6 pounds in one week). Was drinking 3 bottles of green tea and several bottles of soda daily but soda craving has decreased and he is down to 2-3 sodas daily. Eats mostly fast food. Was seen 02/25/14 by Dr. Dayton Martes and had same complaint of fatigue. Labs done were normal.   Thinks he had a kidney stone last week and thinks it passed. No current symptoms. Has history of kidney stones.   Rash on right achilles, comes and goes. Very itchy, he scratches it until it bleeds. Soles of feet with chronic peeling for many years. Left great toe with chronic discoloration and thickening.   Bilateral great toes with intermittent pain.   Thinks he has excessive sweating, for several years. Mostly around head, axilla, hands.   Past Medical History:  Diagnosis Date  . Back injury 02/25/2014  . Pollen allergies   . Seasonal allergies    Past Surgical History:  Procedure Laterality Date  . 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 Social History  Substance Use Topics  . Smoking status: Never Smoker  . Smokeless tobacco: Never Used  . Alcohol use Yes     Comment: occ      Review of Systems Per HPI    Objective:   Physical Exam  Constitutional: He is  oriented to person, place, and time. He appears well-developed and well-nourished. No distress.  obese  HENT:  Head: Normocephalic and atraumatic.  Eyes: Conjunctivae are normal.  Cardiovascular: Normal rate.   Pulmonary/Chest: Effort normal.  Musculoskeletal: He exhibits no edema.  Tender over both metatarsals, no edema or erythema. Normal ROM.   Neurological: He is alert and oriented to person, place, and time.  Skin: Skin is warm and dry. He is not diaphoretic.  Bilateral soles with cracking and peeling. Area around right heel with approximately 6 cm oblong area of erythematous macules.   Psychiatric: He has a normal mood and affect. His behavior is normal. Judgment and thought content normal.  Vitals reviewed.        BP (!) 142/100 (BP Location: Right Arm, Patient Position: Sitting, Cuff Size: Normal)   Pulse 100   Temp 98.1 F (36.7 C) (Oral)   Ht  (1.778 m)   Wt 247 lb (112 kg)   SpO2 97%   BMI 35.44 kg/m  Wt Readings from Last 3 Encounters:  08/28/17 247 lb (112 kg)  04/29/15 240 lb (108.9 kg)  03/08/15 240 lb (108.9 kg)  Recheck BP- 108/68  BP Readings from Last 3 Encounters:  08/28/17 (!) 142/100  04/29/15 121/58  03/08/15 103/60   Depression screen PHQ 2/9  08/28/2017  Decreased Interest 0  Down, Depressed, Hopeless 0  PHQ - 2 Score 0      Assessment & Plan:  1. Encounter to establish care - Discussed and encouraged healthy lifestyle choices- adequate sleep, regular exercise, stress management and healthy food choices.   2. Other fatigue - discussed chronic nature and likely related to lifestyle- inadequate sleep, stress, obesity and diet. - Comprehensive metabolic panel - CBC - Hemoglobin A1c - TSH - Vitamin B12 - VITAMIN D 25 Hydroxy (Vit-D Deficiency, Fractures)  3. BMI 35.0-35.9,adult - discussed diet and encouraged him to significantly decrease soda intake with goal of quitting, discussed role of adequate sleep in weight loss, provided  information about Mediterranean diet and encouraged meal planning and decreased fast food - discussed phentermine and potential side effects and encouraged him to work on healthy food choices and improving sleep - Comprehensive metabolic panel - CBC - Hemoglobin A1c - Lipid panel - TSH - Vitamin B12  4. Excessive sweating - Comprehensive metabolic panel - Hemoglobin A1c - TSH  5. Acquired dysmorphic toenail - Ambulatory referral to Podiatry  6. Pain of great toe, unspecified laterality - Ambulatory referral to Podiatry  7. Need for Tdap vaccination - Tdap vaccine greater than or equal to 7yo IM  8. Cracked skin on feet - encouraged daily moisturizing - Ambulatory referral to Podiatry  - follow up in 1 month  Over 45 minutes were spent face-to-face with the patient during this encounter and >50% of that time was spent on counseling and coordination of care   Olean Ree, FNP-BC  [facility], [facility]  08/28/2017 9:15 PM

## 2017-08-29 LAB — COMPREHENSIVE METABOLIC PANEL
ALT: 43 U/L (ref 0–53)
AST: 23 U/L (ref 0–37)
Albumin: 4.8 g/dL (ref 3.5–5.2)
Alkaline Phosphatase: 58 U/L (ref 39–117)
BUN: 15 mg/dL (ref 6–23)
CHLORIDE: 102 meq/L (ref 96–112)
CO2: 31 mEq/L (ref 19–32)
Calcium: 10 mg/dL (ref 8.4–10.5)
Creatinine, Ser: 1.26 mg/dL (ref 0.40–1.50)
GFR: 69.36 mL/min (ref >60.00)
Glucose, Bld: 86 mg/dL (ref 70–99)
POTASSIUM: 4.4 meq/L (ref 3.5–5.1)
SODIUM: 140 meq/L (ref 135–145)
Total Bilirubin: 1.3 mg/dL — ABNORMAL HIGH (ref 0.2–1.2)
Total Protein: 7.9 g/dL (ref 6.0–8.3)

## 2017-08-29 LAB — CBC
HEMATOCRIT: 43.3 % (ref 39.0–52.0)
Hemoglobin: 14.7 g/dL (ref 13.0–17.0)
MCHC: 34 g/dL (ref 30.0–36.0)
MCV: 84.7 fl (ref 78.0–100.0)
Platelets: 179 10*3/uL (ref 150.0–400.0)
RBC: 5.11 Mil/uL (ref 4.22–5.81)
RDW: 13.3 % (ref 11.5–15.5)
WBC: 6.6 10*3/uL (ref 4.0–10.5)

## 2017-08-29 LAB — LDL CHOLESTEROL, DIRECT: LDL DIRECT: 141 mg/dL

## 2017-08-29 LAB — LIPID PANEL
Cholesterol: 206 mg/dL — ABNORMAL HIGH (ref 0–200)
HDL: 28.7 mg/dL — ABNORMAL LOW (ref >39.00)
NonHDL: 176.89
Total CHOL/HDL Ratio: 7
Triglycerides: 345 mg/dL — ABNORMAL HIGH (ref 0.0–149.0)
VLDL: 69 mg/dL — ABNORMAL HIGH (ref 0.0–40.0)

## 2017-08-29 LAB — VITAMIN D 25 HYDROXY (VIT D DEFICIENCY, FRACTURES): VITD: 28.75 ng/mL — ABNORMAL LOW (ref 30.00–100.00)

## 2017-08-29 LAB — HEMOGLOBIN A1C: HEMOGLOBIN A1C: 5.5 % (ref 4.6–6.5)

## 2017-08-29 LAB — TSH: TSH: 1.76 u[IU]/mL (ref 0.35–4.50)

## 2017-08-29 LAB — VITAMIN B12: Vitamin B-12: 201 pg/mL — ABNORMAL LOW (ref 211–911)

## 2017-09-11 ENCOUNTER — Emergency Department (HOSPITAL_COMMUNITY): Payer: 59

## 2017-09-11 ENCOUNTER — Encounter (HOSPITAL_COMMUNITY): Payer: Self-pay

## 2017-09-11 ENCOUNTER — Emergency Department (HOSPITAL_COMMUNITY)
Admission: EM | Admit: 2017-09-11 | Discharge: 2017-09-11 | Disposition: A | Discharge status: Home / Self Care | Payer: 59 | Attending: Emergency Medicine | Admitting: Emergency Medicine

## 2017-09-11 DIAGNOSIS — N200 Calculus of kidney: Secondary | ICD-10-CM | POA: Diagnosis not present

## 2017-09-11 DIAGNOSIS — K921 Melena: Secondary | ICD-10-CM | POA: Insufficient documentation

## 2017-09-11 DIAGNOSIS — R1084 Generalized abdominal pain: Secondary | ICD-10-CM | POA: Diagnosis present

## 2017-09-11 LAB — URINALYSIS, ROUTINE W REFLEX MICROSCOPIC
BILIRUBIN URINE: NEGATIVE
GLUCOSE, UA: NEGATIVE mg/dL
Hgb urine dipstick: NEGATIVE
KETONES UR: NEGATIVE mg/dL
Leukocytes, UA: NEGATIVE
Nitrite: NEGATIVE
PH: 5 (ref 5.0–8.0)
Protein, ur: NEGATIVE mg/dL
SPECIFIC GRAVITY, URINE: 1.028 (ref 1.005–1.030)

## 2017-09-11 LAB — CBC
HEMATOCRIT: 42.3 % (ref 39.0–52.0)
HEMOGLOBIN: 14.5 g/dL (ref 13.0–17.0)
MCH: 28.9 pg (ref 26.0–34.0)
MCHC: 34.3 g/dL (ref 30.0–36.0)
MCV: 84.3 fL (ref 78.0–100.0)
Platelets: 160 10*3/uL (ref 150–400)
RBC: 5.02 MIL/uL (ref 4.22–5.81)
RDW: 13 % (ref 11.5–15.5)
WBC: 11.5 10*3/uL — ABNORMAL HIGH (ref 4.0–10.5)

## 2017-09-11 LAB — COMPREHENSIVE METABOLIC PANEL
ALT: 64 U/L — ABNORMAL HIGH (ref 17–63)
AST: 34 U/L (ref 15–41)
Albumin: 4.5 g/dL (ref 3.5–5.0)
Alkaline Phosphatase: 61 U/L (ref 38–126)
Anion gap: 9 (ref 5–15)
BILIRUBIN TOTAL: 1.3 mg/dL — AB (ref 0.3–1.2)
BUN: 20 mg/dL (ref 6–20)
CALCIUM: 9.5 mg/dL (ref 8.9–10.3)
CO2: 28 mmol/L (ref 22–32)
Chloride: 101 mmol/L (ref 101–111)
Creatinine, Ser: 1.83 mg/dL — ABNORMAL HIGH (ref 0.61–1.24)
GFR calc non Af Amer: 47 mL/min — ABNORMAL LOW (ref >60)
GFR, EST AFRICAN AMERICAN: 54 mL/min — AB (ref >60)
GLUCOSE: 126 mg/dL — AB (ref 65–99)
Potassium: 3.9 mmol/L (ref 3.5–5.1)
Sodium: 138 mmol/L (ref 135–145)
TOTAL PROTEIN: 7.6 g/dL (ref 6.5–8.1)

## 2017-09-11 LAB — POC OCCULT BLOOD, ED: Fecal Occult Bld: POSITIVE — AB

## 2017-09-11 LAB — LIPASE, BLOOD: Lipase: 33 U/L (ref 11–51)

## 2017-09-11 MED ORDER — ONDANSETRON HCL 4 MG/2ML IJ SOLN
4.0000 mg | Freq: Once | INTRAMUSCULAR | Status: AC
  Administered 2017-09-11: 4 mg via INTRAVENOUS
  Filled 2017-09-11: qty 2

## 2017-09-11 MED ORDER — SODIUM CHLORIDE 0.9 % IV BOLUS (SEPSIS)
1000.0000 mL | Freq: Once | INTRAVENOUS | Status: AC
  Administered 2017-09-11: 1000 mL via INTRAVENOUS

## 2017-09-11 MED ORDER — OXYCODONE-ACETAMINOPHEN 5-325 MG PO TABS
1.0000 | ORAL_TABLET | ORAL | Status: DC | PRN
  Administered 2017-09-11: 1 via ORAL

## 2017-09-11 MED ORDER — HYDROMORPHONE HCL 1 MG/ML IJ SOLN
1.0000 mg | Freq: Once | INTRAMUSCULAR | Status: AC
  Administered 2017-09-11: 1 mg via INTRAVENOUS
  Filled 2017-09-11: qty 1

## 2017-09-11 MED ORDER — IOPAMIDOL (ISOVUE-300) INJECTION 61%
100.0000 mL | Freq: Once | INTRAVENOUS | Status: AC | PRN
  Administered 2017-09-11: 100 mL via INTRAVENOUS

## 2017-09-11 MED ORDER — OXYCODONE-ACETAMINOPHEN 5-325 MG PO TABS
ORAL_TABLET | ORAL | Status: AC
  Filled 2017-09-11: qty 1

## 2017-09-11 MED ORDER — MORPHINE SULFATE (PF) 4 MG/ML IV SOLN
4.0000 mg | Freq: Once | INTRAVENOUS | Status: AC
  Administered 2017-09-11: 4 mg via INTRAVENOUS
  Filled 2017-09-11: qty 1

## 2017-09-11 MED ORDER — ONDANSETRON 4 MG PO TBDP: 4.0000 mg | ORAL_TABLET | Freq: Three times a day (TID) | ORAL | 0 refills | Status: DC | PRN

## 2017-09-11 MED ORDER — OXYCODONE-ACETAMINOPHEN 5-325 MG PO TABS: 1.0000 | ORAL_TABLET | Freq: Four times a day (QID) | ORAL | 0 refills | Status: DC | PRN

## 2017-09-11 NOTE — ED Notes (Signed)
ED Provider at bedside. 

## 2017-09-11 NOTE — ED Notes (Signed)
Pt standing up fully dressed leaning over bed when RN came in room;pt states he has not felt any pay relief from any meds administered; EDP notified

## 2017-09-11 NOTE — ED Triage Notes (Signed)
Pt arrives to ed at&ox 4; pt c/o back pain that goes into abdominal pain; pt states he has hx of Kidney stone and pain feels similar to last Kidney stone; pt states he has been unable to urinate today; pt states pain at 10/10; pt took Percocet at home around 4 pm; [facility];Monique,RN

## 2017-09-11 NOTE — ED Provider Notes (Signed)
TIME SEEN: 2:16 AM  CHIEF COMPLAINT: left flank pain, abdominal pain, bloody stool  HPI: Pt is a 36 y.o. male with history of previous kidney stones that did not require intervention who presents emergency department with left flank pain that has been intermittent for the past 2 weeks that worsened tonight and radiated into the abdomen both lower and upper abdomen with bloody stools. No melena. No fevers. Has had nausea and vomiting. No diarrhea. No history of abdominal surgery. States that this initially felt like his kidney stone but when the pain radiated into his upper abdomen and he had bloody stools he became more concerned. He does not have a urologist. No antiplatelet or anticoagulant use.  ROS: See HPI Constitutional: no fever  Eyes: no drainage  ENT: no runny nose   Cardiovascular:  no chest pain  Resp: no SOB  GI: no vomiting GU: no dysuria Integumentary: no rash  Allergy: no hives  Musculoskeletal: no leg swelling  Neurological: no slurred speech ROS otherwise negative  PAST MEDICAL HISTORY/PAST SURGICAL HISTORY:  Past Medical History:  Diagnosis Date  . Back injury 02/25/2014  . Pollen allergies   . Seasonal allergies     MEDICATIONS:  Prior to Admission medications   Medication Sig Start Date End Date Taking? Authorizing Provider  acetaminophen (TYLENOL) 500 MG tablet Take 2 tablets (1,000 mg total) by mouth every 8 (eight) hours as needed for moderate pain. Take 2 tabs every 8 hours. 11/01/14   Ofilia Neaslark, Michael L, PA-C  cetirizine (ZYRTEC) 10 MG tablet Take 10 mg by mouth daily.    [provider]  Multiple Vitamins-Minerals (MULTIVITAMIN WITH MINERALS) tablet Take 1 tablet by mouth daily.    [provider]    ALLERGIES:  No Known Allergies  SOCIAL HISTORY:  Social History  Substance Use Topics  . Smoking status: Never Smoker  . Smokeless tobacco: Never Used  . Alcohol use Yes     Comment: occ    FAMILY HISTORY: History reviewed. No  pertinent family history.  EXAM: BP (!) 134/97 (BP Location: Right Arm)   Pulse 76   Temp 97.7 F (36.5 C) (Oral)   Resp 18   SpO2 100%  CONSTITUTIONAL: Alert and oriented and responds appropriately to questions. Well-appearing; well-nourished HEAD: Normocephalic EYES: Conjunctivae clear, pupils appear equal, EOMI ENT: normal nose; moist mucous membranes NECK: Supple, no meningismus, no nuchal rigidity, no LAD  CARD: RRR; S1 and S2 appreciated; no murmurs, no clicks, no rubs, no gallops RESP: Normal chest excursion without splinting or tachypnea; breath sounds clear and equal bilaterally; no wheezes, no rhonchi, no rales, no hypoxia or respiratory distress, speaking full sentences ABD/GI: Normal bowel sounds; non-distended; soft, mildly tender throughout the abdomen, no rebound, no guarding, no peritoneal signs, no hepatosplenomegaly RECTAL:  Normal rectal tone, patient does have a small amount of gross maroon blood on exam, nomelena, guaiac positive, no hemorrhoids appreciated, nontender rectal exam, no fecal impaction BACK:  The back appears normal and is non-tender to palpation, there is no CVA tenderness EXT: Normal ROM in all joints; non-tender to palpation; no edema; normal capillary refill; no cyanosis, no calf tenderness or swelling    SKIN: Normal color for age and race; warm; no rash NEURO: Moves all extremities equally PSYCH: The patient's mood and manner are appropriate. Grooming and personal hygiene are appropriate.  MEDICAL DECISION MAKING: Pt here with flank pain that he initially thought was his kidney stones. States pain has been intermittent and got worse tonight  going into the abdomen with placed stools. Mildly tender throughout the abdomen. Differential still includes kidney stone but also includes colitis, diverticulitis, bowel obstruction, appendicitis. Labs obtained in triage show mild history of present illness with creatinine of 1.83. We'll give 2 L of IV fluids and  avoid NSAIDs. Hemoglobin normal at 14.5. Urine shows no blood or sign of infection. LFTs, lipase normal. Will obtain CT scan for further evaluation. Will give a pain and nausea medicine.  ED PROGRESS: Patient reports his pain is been well controlled after 4mg  of morphine and 2 mg of IV Dilaudid.  He has received 2 L of IV fluids. He is comfortable wth plan for discharge home. CT scan shows 2 mm left UVJ stone with mild left hydroureteronephrosis. No sign of UTI. He has hepatic steatosis which is chronic for him. Normal LFTs. Have given him GI follow-up given his history of bloody stool. Hemoglobin normal. No active hemorrhage. He states he has had intermittent bloody stool for "years". No hematemesis. No melena. I do not feel he needs emergent colonoscopy but this can be done as an outpatient on a nonemergent basis. Given outpatient urology follow-up. Recommended recheck of his creatinine with his PCP in one week. Recommend increase fluid intake and will discharge with Percocet, Zofran. Recommended he avoid NSAIDs.   At this time, I do not feel there is any life-threatening condition present. I have reviewed and discussed all results (EKG, imaging, lab, urine as appropriate) and exam findings with patient/family. I have reviewed nursing notes and appropriate previous records.  I feel the patient is safe to be discharged home without further emergent workup and can continue workup as an outpatient as needed. Discussed usual and customary return precautions. Patient/family verbalize understanding and are comfortable with this plan.  Outpatient follow-up has been provided if needed. All questions have been answered.      Ledford Goodson, Layla Maw, DO 09/11/17 [phone]

## 2017-09-11 NOTE — Discharge Instructions (Signed)
Please avoid NSAIDs such as aspirin (Goody powders), ibuprofen (Motrin, Advil), naproxen (Aleve) as these may worsen your kidney function.   Your creatinine was elevated today at 1.83. Please increase your water intake and follow-up with your primary care physician to have this rechecked in one week.   I recommend close follow-up with a gastroenterologist for your intermittent bloody stools.   I recommend follow-up with urology for your kidney stone.

## 2017-09-18 ENCOUNTER — Ambulatory Visit (INDEPENDENT_AMBULATORY_CARE_PROVIDER_SITE_OTHER): Payer: 59 | Admitting: Podiatry

## 2017-09-18 ENCOUNTER — Encounter: Payer: Self-pay | Admitting: Podiatry

## 2017-09-18 DIAGNOSIS — B351 Tinea unguium: Secondary | ICD-10-CM | POA: Diagnosis not present

## 2017-09-18 DIAGNOSIS — B353 Tinea pedis: Secondary | ICD-10-CM

## 2017-09-18 MED ORDER — CLOTRIMAZOLE-BETAMETHASONE 1-0.05 % EX CREA: 1.0000 "application " | TOPICAL_CREAM | Freq: Two times a day (BID) | CUTANEOUS | 3 refills | Status: DC

## 2017-09-18 MED ORDER — TERBINAFINE HCL 250 MG PO TABS: 250.0000 mg | ORAL_TABLET | Freq: Every day | ORAL | 0 refills | Status: DC

## 2017-09-21 NOTE — Progress Notes (Signed)
   Subjective: Patient presents today for left great toenail fungus that appeared about 2 years ago. He also reports some dry, cracked, peeling skin of the posterior right ankle for the past several months. He has used OTC creams with no significant relief. Patient presents today for further treatment and evaluation.   Past Medical History:  Diagnosis Date  . Back injury 02/25/2014  . Pollen allergies   . Seasonal allergies     Objective: Physical Exam General: The patient is alert and oriented x3 in no acute distress.  Dermatology: Hyperkeratotic, discolored, thickened, onychodystrophy of left great toenail. Pruritus to the interdigital areas to bilateral feet with hyperkeratosis. Skin is warm, dry and supple bilateral lower extremities. Negative for open lesions or macerations.  Vascular: Palpable pedal pulses bilaterally. No edema or erythema noted. Capillary refill within normal limits.  Neurological: Epicritic and protective threshold grossly intact bilaterally.   Musculoskeletal Exam: Range of motion within normal limits to all pedal and ankle joints bilateral. Muscle strength 5/5 in all groups bilateral.   Assessment: #1 onychomycosis left great toenail #2 tinea pedis bilateral  Plan of Care:  #1 Patient was evaluated. #2 Prescription for Lotrisone cream given to patient. #3 prescription for Lamisil 250 mg #90 given to patient. #4 return to clinic in 4 weeks.  Felecia ShellingBrent M. Roopa Graver, DPM [facility]  Dr. Felecia ShellingBrent M. Demarr Kluever, DPM    [address]                                        [place], [place] [address]                Office [phone]  Fax [phone]

## 2017-10-16 ENCOUNTER — Encounter: Payer: 59 | Admitting: Podiatry

## 2017-10-28 NOTE — Progress Notes (Signed)
This encounter was created in error - please disregard.

## 2018-04-14 ENCOUNTER — Encounter: Payer: Self-pay | Admitting: Family Medicine

## 2018-04-14 ENCOUNTER — Other Ambulatory Visit: Payer: Self-pay

## 2018-04-14 ENCOUNTER — Ambulatory Visit (INDEPENDENT_AMBULATORY_CARE_PROVIDER_SITE_OTHER): Payer: 59 | Admitting: Family Medicine

## 2018-04-14 VITALS — BP 126/78 | HR 83 | Temp 98.7°F | Ht 70.25 in | Wt 245.6 lb

## 2018-04-14 DIAGNOSIS — E669 Obesity, unspecified: Secondary | ICD-10-CM

## 2018-04-14 DIAGNOSIS — Z6835 Body mass index (BMI) 35.0-35.9, adult: Secondary | ICD-10-CM | POA: Diagnosis not present

## 2018-04-14 MED ORDER — PHENTERMINE HCL 15 MG PO CAPS: 15.0000 mg | ORAL_CAPSULE | ORAL | 0 refills | Status: DC

## 2018-04-14 MED ORDER — CLOTRIMAZOLE-BETAMETHASONE 1-0.05 % EX CREA: 1.0000 "application " | TOPICAL_CREAM | Freq: Two times a day (BID) | CUTANEOUS | 3 refills | Status: DC

## 2018-04-14 NOTE — Patient Instructions (Addendum)
Great to see you. We are starting phentermine 15 mg daily.  Please come see me in 1 month. 

## 2018-04-14 NOTE — Progress Notes (Signed)
Subjective:   Patient ID: Jerry Schmidt, male    DOB: [birth year], 37 y.o.   MRN: [id number]  Jerry Schmidt is a pleasant 37 y.o. year old male who presents to clinic today with New Patient (Initial Visit) (Patient is here today to re-establish care with Dr. Dayton Martes.  Last seen by Dr. Dayton Martes in 2015.  He is not fasting.  )  on 04/14/2018  HPI:  Obesity- he feels he and his wife have tried everything to lose weight.  He is very active, also coaches baseball.  His wife is dieting and on phentermine and he would like to consider a short trial of this as well.  No longer drinks sodas/caffeien (since he had a kidney stone_. No h/o HTN.    Current Outpatient Medications on File Prior to Visit  Medication Sig Dispense Refill  . acetaminophen (TYLENOL) 500 MG tablet Take 2 tablets (1,000 mg total) by mouth every 8 (eight) hours as needed for moderate pain. Take 2 tabs every 8 hours. 30 tablet 0  . cetirizine (ZYRTEC) 10 MG tablet Take 10 mg by mouth daily.    . clotrimazole-betamethasone (LOTRISONE) cream Apply 1 application topically 2 (two) times daily. 45 g 3  . terbinafine (LAMISIL) 250 MG tablet Take 1 tablet (250 mg total) by mouth daily. (Patient not taking: Reported on 04/14/2018) 90 tablet 0  . [DISCONTINUED] fluticasone (FLONASE) 50 MCG/ACT nasal spray Place 2 sprays into the nose daily. 16 g 0   No current facility-administered medications on file prior to visit.     No Known Allergies  Past Medical History:  Diagnosis Date  . Allergy   . Back injury 02/25/2014  . Pollen allergies   . Seasonal allergies     Past Surgical History:  Procedure Laterality Date  . 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 Depression Mother   . Diabetes Mother   . Drug abuse Mother   . Hyperlipidemia Mother   . Hypertension Mother   . Hyperlipidemia Father   . Hypertension Father   . Drug abuse Brother     . Diabetes Maternal Grandmother   . Drug abuse Maternal Grandmother     Social History   Socioeconomic History  . Marital status: Married    Spouse name: Not on file  . Number of children: Not on file  . Years of education: Not on file  . Highest education level: Not on file  Occupational History  . Not on file  Social Needs  . Financial resource strain: Not on file  . Food insecurity:    Worry: Not on file    Inability: Not on file  . Transportation needs:    Medical: Not on file    Non-medical: Not on file  Tobacco Use  . Smoking status: Never Smoker  . Smokeless tobacco: Never Used  Substance and Sexual Activity  . Alcohol use: Yes    Comment: occ  . Drug use: No  . Sexual activity: Yes    Partners: Female    Birth control/protection: None  Lifestyle  . Physical activity:    Days per week: Not on file    Minutes per session: Not on file  . Stress: Not on file  Relationships  . Social connections:    Talks on phone: Not on file    Gets together: Not on file  Attends religious service: Not on file    Active member of club or organization: Not on file    Attends meetings of clubs or organizations: Not on file    Relationship status: Not on file  . Intimate partner violence:    Fear of current or ex partner: Not on file    Emotionally abused: Not on file    Physically abused: Not on file    Forced sexual activity: Not on file  Other Topics Concern  . Not on file  Social History Narrative  . Not on file   The PMH, PSH, Social History, Family History, Medications, and allergies have been reviewed in [facility], and have been updated if relevant.   Review of Systems  HENT: Negative.   Respiratory: Negative.   Cardiovascular: Negative.   Musculoskeletal: Negative.   Neurological: Negative.   Hematological: Negative.   Psychiatric/Behavioral: Negative.   All other systems reviewed and are negative.      Objective:    BP 126/78 (BP Location: Left Arm,  Patient Position: Sitting, Cuff Size: Normal)   Pulse 83   Temp 98.7 F (37.1 C) (Oral)   Ht 5' 10.25" (1.784 m)   Wt 245 lb 9.6 oz (111.4 kg)   SpO2 97%   BMI 34.99 kg/m    Physical Exam  Constitutional: He is oriented to person, place, and time. He appears well-developed and well-nourished. No distress.  HENT:  Head: Normocephalic and atraumatic.  Eyes: EOM are normal.  Cardiovascular: Normal rate and regular rhythm.  Pulmonary/Chest: Effort normal and breath sounds normal.  Musculoskeletal: Normal range of motion. He exhibits no edema.  Neurological: He is alert and oriented to person, place, and time. No cranial nerve deficit.  Skin: Skin is warm and dry. He is not diaphoretic.  Psychiatric: He has a normal mood and affect. His behavior is normal. Judgment and thought content normal.  Nursing note and vitals reviewed.         Assessment & Plan:   BMI 35.0-35.9,adult No follow-ups on file.

## 2018-04-14 NOTE — Assessment & Plan Note (Signed)
Discussed weight loss plan.  Pt would also like to discuss medication options- discussed phentermine risk benefits, side effects including HTN, pulmonary HTN, stroke.    He would like to start phentermine and lifestyle changes.  Follow up in 1 month.  If BMI < 27 will decrease to half dose x 1 month then stop

## 2018-05-12 ENCOUNTER — Ambulatory Visit (INDEPENDENT_AMBULATORY_CARE_PROVIDER_SITE_OTHER): Payer: 59 | Admitting: Family Medicine

## 2018-05-12 ENCOUNTER — Encounter: Payer: Self-pay | Admitting: Family Medicine

## 2018-05-12 VITALS — BP 118/84 | HR 88 | Temp 98.5°F | Ht 70.0 in | Wt 242.6 lb

## 2018-05-12 DIAGNOSIS — Z6835 Body mass index (BMI) 35.0-35.9, adult: Secondary | ICD-10-CM | POA: Diagnosis not present

## 2018-05-12 DIAGNOSIS — E669 Obesity, unspecified: Secondary | ICD-10-CM

## 2018-05-12 MED ORDER — PHENTERMINE HCL 15 MG PO CAPS: 15.0000 mg | ORAL_CAPSULE | Freq: Two times a day (BID) | ORAL | 2 refills | Status: DC

## 2018-05-12 NOTE — Progress Notes (Signed)
Subjective:   Patient ID: Jerry Schmidt, male    DOB: December 10, 1980, 37 y.o.   MRN: [id number]  Jerry Schmidt is a pleasant 37 y.o. year old male who presents to clinic today with Follow-up (Patient is here today to F/U with weight loss.  At 5.20.19 visit he weighed 245 lb 9.6 oz and he was started on Phentermine 15mg  1qd. Today he weighs 242.6 lb.  He states that it seems to be working and he has little bit more energy.  He takes it at [facility] and he can tell it's gone by noon time.  He would like to increase the dosage if possible.)  on 05/12/2018  HPI:  Obesity-   Patient is here today to F/U with weight loss. At 5.20.19 visit he weighed 245 lb 9.6 oz and he was started on Phentermine 15mg  1qd. He states that it seems to be working and he has little bit more energy.   No palpitations, chest pain, SOB.  Playing softball.  Wt Readings from Last 3 Encounters:  05/12/18 242 lb 9.6 oz (110 kg)  04/14/18 245 lb 9.6 oz (111.4 kg)  08/28/17 247 lb (112 kg)    Current Outpatient Medications on File Prior to Visit  Medication Sig Dispense Refill  . acetaminophen (TYLENOL) 500 MG tablet Take 2 tablets (1,000 mg total) by mouth every 8 (eight) hours as needed for moderate pain. Take 2 tabs every 8 hours. 30 tablet 0  . cetirizine (ZYRTEC) 10 MG tablet Take 10 mg by mouth daily.    . clotrimazole-betamethasone (LOTRISONE) cream Apply 1 application topically 2 (two) times daily. 45 g 3  . phentermine 15 MG capsule Take 1 capsule (15 mg total) by mouth every morning. 30 capsule 0  . terbinafine (LAMISIL) 250 MG tablet Take 1 tablet (250 mg total) by mouth daily. (Patient not taking: Reported on 04/14/2018) 90 tablet 0  . [DISCONTINUED] fluticasone (FLONASE) 50 MCG/ACT nasal spray Place 2 sprays into the nose daily. 16 g 0   No current facility-administered medications on file prior to visit.     No Known Allergies  Past Medical History:  Diagnosis Date  . Allergy   . Back injury  02/25/2014  . Pollen allergies   . Seasonal allergies     Past Surgical History:  Procedure Laterality Date  . 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 Depression Mother   . Diabetes Mother   . Drug abuse Mother   . Hyperlipidemia Mother   . Hypertension Mother   . Hyperlipidemia Father   . Hypertension Father   . Drug abuse Brother   . Diabetes Maternal Grandmother   . Drug abuse Maternal Grandmother     Social History   Socioeconomic History  . Marital status: Married    Spouse name: Not on file  . Number of children: Not on file  . Years of education: Not on file  . Highest education level: Not on file  Occupational History  . Not on file  Social Needs  . Financial resource strain: Not on file  . Food insecurity:    Worry: Not on file    Inability: Not on file  . Transportation needs:    Medical: Not on file    Non-medical: Not on file  Tobacco Use  . Smoking status: Never Smoker  . Smokeless tobacco: Never Used  Substance  and Sexual Activity  . Alcohol use: Yes    Comment: occ  . Drug use: No  . Sexual activity: Yes    Partners: Female    Birth control/protection: None  Lifestyle  . Physical activity:    Days per week: Not on file    Minutes per session: Not on file  . Stress: Not on file  Relationships  . Social connections:    Talks on phone: Not on file    Gets together: Not on file    Attends religious service: Not on file    Active member of club or organization: Not on file    Attends meetings of clubs or organizations: Not on file    Relationship status: Not on file  . Intimate partner violence:    Fear of current or ex partner: Not on file    Emotionally abused: Not on file    Physically abused: Not on file    Forced sexual activity: Not on file  Other Topics Concern  . Not on file  Social History Narrative  . Not on file   The PMH, PSH, Social History,  Family History, Medications, and allergies [facility], and have been updated if relevant.  Review of Systems  Constitutional: Negative.   HENT: Negative.   Respiratory: Negative.   Cardiovascular: Negative.   Gastrointestinal: Negative.   Musculoskeletal: Negative.   Neurological: Negative.   Hematological: Negative.   Psychiatric/Behavioral: Negative.   All other systems reviewed and are negative.      Objective:    BP 118/84 (BP Location: Left Arm, Patient Position: Sitting, Cuff Size: Large)   Pulse (!) 116   Temp 98.5 F (36.9 C) (Oral)   Ht 5\' 10"  (1.778 m)   Wt 242 lb 9.6 oz (110 kg)   SpO2 95%   BMI 34.81 kg/m    Physical Exam  Constitutional: He is oriented to person, place, and time. He appears well-developed and well-nourished. No distress.  HENT:  Head: Normocephalic and atraumatic.  Eyes: EOM are normal.  Neck: Neck supple.  Cardiovascular: Normal rate.  Pulmonary/Chest: Effort normal.  Musculoskeletal: Normal range of motion. He exhibits no edema.  Neurological: He is alert and oriented to person, place, and time. No cranial nerve deficit.  Skin: Skin is warm. He is not diaphoretic.  Psychiatric: He has a normal mood and affect. His behavior is normal. Judgment and thought content normal.  Nursing note and vitals reviewed.         Assessment & Plan:   BMI 35.0-35.9,adult No follow-ups on file.

## 2018-05-12 NOTE — Patient Instructions (Signed)
Great to see you. Keep up the great work!  We are increasing your phentermine to 15 mg twice daily.  Come see me in 3 months.

## 2018-05-12 NOTE — Assessment & Plan Note (Signed)
Tolerating phentermine well.  He does feel it wears off mid day and would like to try to increase dose. Increase phentermine to 30 mg twice daily (second dose around lunch). Follow up in 3 months, sooner if BP or pulse increase or he has any other symptoms. The patient indicates understanding of these issues and agrees with the plan.

## 2018-08-11 ENCOUNTER — Encounter: Payer: Self-pay | Admitting: Family Medicine

## 2018-08-11 ENCOUNTER — Telehealth: Payer: Self-pay | Admitting: Family Medicine

## 2018-08-11 ENCOUNTER — Ambulatory Visit (INDEPENDENT_AMBULATORY_CARE_PROVIDER_SITE_OTHER): Payer: 59 | Admitting: Family Medicine

## 2018-08-11 VITALS — BP 114/84 | HR 88 | Temp 98.2°F | Ht 70.0 in | Wt 239.2 lb

## 2018-08-11 DIAGNOSIS — Z6835 Body mass index (BMI) 35.0-35.9, adult: Secondary | ICD-10-CM | POA: Diagnosis not present

## 2018-08-11 MED ORDER — PHENTERMINE HCL 37.5 MG PO CAPS: 37.5000 mg | ORAL_CAPSULE | ORAL | 2 refills | Status: DC

## 2018-08-11 NOTE — Progress Notes (Signed)
Subjective:   Patient ID: Jerry Schmidt, male    DOB: 11/26/1980, 37 y.o.   MRN: [id number]  Jerry Schmidt is a pleasant 37 y.o. year old male who presents to clinic today with Follow-up (Patient is here today for a 5757-month-F/U weight loss.  He was last seen on 6.17.19 and his weight was 242 lbs.  He felt as if it would wear off mid-day so Phentermine dosage was increased to 15mg  bid and pt directed to F/U in 3 months but sooner if BP or Pulse was elevated.  Today he weighs 239 lbs.  He states that he would like to be able to take just 1qd of something. He will increase his H2O intake and increase exercise with it to try and increase his weight loss.  He does state that sickness has gone) and addendum (sickness has gone through his house and there has been the increased stress of his truck being broken down so he has not been able to concentrate on himself as much lately. )  on 08/11/2018  HPI:  Obesity-  3 month follow up.  Last saw him in June for this. Note reviewed from 05/12/18- phentermine increased to 15 mg twice daily. He often forgets to take the second dose.  Asking for something he can take once a day and maybe a little stronger.  Felt this dose wore off.  Denies HA, CP, blurred vision or SOB.  Wt Readings from Last 3 Encounters:  08/11/18 239 lb 3.2 oz (108.5 kg)  05/12/18 242 lb 9.6 oz (110 kg)  04/14/18 245 lb 9.6 oz (111.4 kg)    Current Outpatient Medications on File Prior to Visit  Medication Sig Dispense Refill  . acetaminophen (TYLENOL) 500 MG tablet Take 2 tablets (1,000 mg total) by mouth every 8 (eight) hours as needed for moderate pain. Take 2 tabs every 8 hours. 30 tablet 0  . cetirizine (ZYRTEC) 10 MG tablet Take 10 mg by mouth daily.    . clotrimazole-betamethasone (LOTRISONE) cream Apply 1 application topically 2 (two) times daily. 45 g 3  . terbinafine (LAMISIL) 250 MG tablet Take 1 tablet (250 mg total) by mouth daily. (Patient not taking: Reported  on 04/14/2018) 90 tablet 0  . [DISCONTINUED] fluticasone (FLONASE) 50 MCG/ACT nasal spray Place 2 sprays into the nose daily. 16 g 0   No current facility-administered medications on file prior to visit.     No Known Allergies  Past Medical History:  Diagnosis Date  . Allergy   . Back injury 02/25/2014  . Pollen allergies   . Seasonal allergies     Past Surgical History:  Procedure Laterality Date  . 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 Depression Mother   . Diabetes Mother   . Drug abuse Mother   . Hyperlipidemia Mother   . Hypertension Mother   . Hyperlipidemia Father   . Hypertension Father   . Drug abuse Brother   . Diabetes Maternal Grandmother   . Drug abuse Maternal Grandmother     Social History   Socioeconomic History  . Marital status: Married    Spouse name: Not on file  . Number of children: Not on file  . Years of education: Not on file  . Highest education level: Not on file  Occupational History  . Not on file  Social Needs  . Physicist, medicalinancial resource  strain: Not on file  . Food insecurity:    Worry: Not on file    Inability: Not on file  . Transportation needs:    Medical: Not on file    Non-medical: Not on file  Tobacco Use  . Smoking status: Never Smoker  . Smokeless tobacco: Never Used  Substance and Sexual Activity  . Alcohol use: Yes    Comment: occ  . Drug use: No  . Sexual activity: Yes    Partners: Female    Birth control/protection: None  Lifestyle  . Physical activity:    Days per week: Not on file    Minutes per session: Not on file  . Stress: Not on file  Relationships  . Social connections:    Talks on phone: Not on file    Gets together: Not on file    Attends religious service: Not on file    Active member of club or organization: Not on file    Attends meetings of clubs or organizations: Not on file    Relationship status: Not on file    . Intimate partner violence:    Fear of current or ex partner: Not on file    Emotionally abused: Not on file    Physically abused: Not on file    Forced sexual activity: Not on file  Other Topics Concern  . Not on file  Social History Narrative  . Not on file   The PMH, PSH, Social History, Family History, Medications, and allergies have been reviewed in [facility], and have been updated if relevant.    Review of Systems  Constitutional: Negative.   HENT: Negative.   Respiratory: Negative.   Cardiovascular: Negative.   Musculoskeletal: Negative.   Skin: Negative.   Neurological: Negative.   Hematological: Negative.   Psychiatric/Behavioral: Negative.   All other systems reviewed and are negative.      Objective:    BP 114/84 (BP Location: Left Arm, Patient Position: Sitting, Cuff Size: Normal)   Pulse 88   Temp 98.2 F (36.8 C) (Oral)   Ht 5\' 10"  (1.778 m)   Wt 239 lb 3.2 oz (108.5 kg)   SpO2 98%   BMI 34.32 kg/m    Physical Exam  General:  pleasant male in no acute distress Eyes:  PERRL Ears:  External ear exam shows no significant lesions or deformities.  TMs normal bilaterally Hearing is grossly normal bilaterally. Nose:  External nasal examination shows no deformity or inflammation. Nasal mucosa are pink and moist without lesions or exudates. Mouth:  Oral mucosa and oropharynx without lesions or exudates.  Teeth in good repair. Neck:  no carotid bruit or thyromegaly no cervical or supraclavicular lymphadenopathy  Lungs:  Normal respiratory effort, chest expands symmetrically. Lungs are clear to auscultation, no crackles or wheezes. Heart:  Normal rate and regular rhythm. S1 and S2 normal without gallop, murmur, click, rub or other extra sounds. l pulses are full and equal bilaterally  Extremities:  no edema  Psych:  Good eye contact, not anxious or depressed appearing       Assessment & Plan:   BMI 35.0-35.9,adult No follow-ups on file.

## 2018-08-11 NOTE — Telephone Encounter (Signed)
Spoke with pharmacist and they will get the Rx ready/thx dmf

## 2018-08-11 NOTE — Patient Instructions (Signed)
Great to see you. We are increasing 37.5 mg daily.  Please come see me in 3 months.

## 2018-08-11 NOTE — Assessment & Plan Note (Signed)
Discussed weight loss plan.He is aware of phentermine risk benefits, side effects including HTN, pulmonary HTN, stroke.    She would like to start phentermine and lifestyle changes.  He would like to try an increased, once daily dosage.  eRx sent for phentermine 37.5 mg daily.  Follow up in 3 months.  If BMI < 27 will decrease to half dose x 1 month then stop  >15 minutes spent in face to face time with patient, >50% spent in counselling or coordination of care

## 2018-08-11 NOTE — Telephone Encounter (Unsigned)
Copied from CRM [id number]. Topic: General - Other >> Aug 11, 2018  3:52 PM Gaynelle AduPoole, Shalonda wrote: Reason for CRM:  [facility] pharmacy is calling to request a approval for the phentermine 37.5 MG capsule. Please advise

## 2018-11-10 ENCOUNTER — Ambulatory Visit: Payer: 59 | Admitting: Family Medicine

## 2018-11-11 ENCOUNTER — Encounter: Payer: Self-pay | Admitting: Family Medicine

## 2018-11-11 ENCOUNTER — Ambulatory Visit (INDEPENDENT_AMBULATORY_CARE_PROVIDER_SITE_OTHER): Payer: 59 | Admitting: Family Medicine

## 2018-11-11 VITALS — BP 122/78 | HR 88 | Temp 98.4°F | Ht 70.0 in | Wt 233.2 lb

## 2018-11-11 DIAGNOSIS — B351 Tinea unguium: Secondary | ICD-10-CM

## 2018-11-11 DIAGNOSIS — Z6835 Body mass index (BMI) 35.0-35.9, adult: Secondary | ICD-10-CM

## 2018-11-11 LAB — COMPREHENSIVE METABOLIC PANEL
ALT: 31 U/L (ref 0–53)
AST: 17 U/L (ref 0–37)
Albumin: 4.8 g/dL (ref 3.5–5.2)
Alkaline Phosphatase: 62 U/L (ref 39–117)
BILIRUBIN TOTAL: 1.1 mg/dL (ref 0.2–1.2)
BUN: 16 mg/dL (ref 6–23)
CALCIUM: 10.1 mg/dL (ref 8.4–10.5)
CHLORIDE: 102 meq/L (ref 96–112)
CO2: 32 meq/L (ref 19–32)
CREATININE: 1.08 mg/dL (ref 0.40–1.50)
GFR: 82.29 mL/min (ref >60.00)
GLUCOSE: 96 mg/dL (ref 70–99)
Potassium: 4.9 mEq/L (ref 3.5–5.1)
Sodium: 139 mEq/L (ref 135–145)
Total Protein: 7.6 g/dL (ref 6.0–8.3)

## 2018-11-11 MED ORDER — TERBINAFINE HCL 250 MG PO TABS: 250.0000 mg | ORAL_TABLET | Freq: Every day | ORAL | 0 refills | Status: DC

## 2018-11-11 MED ORDER — PHENTERMINE HCL 37.5 MG PO CAPS: 37.5000 mg | ORAL_CAPSULE | ORAL | 2 refills | Status: DC

## 2018-11-11 NOTE — Progress Notes (Signed)
Subjective:   Patient ID: Jerry Schmidt, male    DOB: 10/16/1981, 37 y.o.   MRN: [id number]  Jerry Schmidt is a pleasant 37 y.o. year old male who presents to clinic today with Weight Loss (Patient is here today to F/U with weight loss.  He was last seen on 9.16.19 and weighed 239 which was down from 242 on 6.17.19.  Phentermine was changed from 15mg  bid to 37.5mg  1qd.  Today he weighs 233.2 lb) and Nail Problem (He is requesting a refill today of Lamisil as he never completed the first round and his 2nd toe on left foot the nail is turning brown.)  on 11/11/2018  HPI:  Here for follow up.  Doing well.  Obesity-  3 month follow up.  Last saw him in September for this.   Dose of phentermine increased to 37.5 mg daily at that time.   Denies HA, CP, blurred vision or SOB.  He would like to continue it a bit longer.  It is helping with cravings and he plans to exercise more when he gets his new job.  Onychomycosis- almost cleared up on his toes but he stopped taking it before he finished the course.  Big toe is thickened and discolored again.   Current Outpatient Medications on File Prior to Visit  Medication Sig Dispense Refill  . acetaminophen (TYLENOL) 500 MG tablet Take 2 tablets (1,000 mg total) by mouth every 8 (eight) hours as needed for moderate pain. Take 2 tabs every 8 hours. 30 tablet 0  . cetirizine (ZYRTEC) 10 MG tablet Take 10 mg by mouth daily.    . clotrimazole-betamethasone (LOTRISONE) cream Apply 1 application topically 2 (two) times daily. (Patient not taking: Reported on 11/11/2018) 45 g 3  . [DISCONTINUED] fluticasone (FLONASE) 50 MCG/ACT nasal spray Place 2 sprays into the nose daily. 16 g 0   No current facility-administered medications on file prior to visit.     No Known Allergies  Past Medical History:  Diagnosis Date  . Allergy   . Back injury 02/25/2014  . Pollen allergies   . Seasonal allergies     Past Surgical History:  Procedure  Laterality Date  . 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 Depression Mother   . Diabetes Mother   . Drug abuse Mother   . Hyperlipidemia Mother   . Hypertension Mother   . Hyperlipidemia Father   . Hypertension Father   . Drug abuse Brother   . Diabetes Maternal Grandmother   . Drug abuse Maternal Grandmother     Social History   Socioeconomic History  . Marital status: Married    Spouse name: Not on file  . Number of children: Not on file  . Years of education: Not on file  . Highest education level: Not on file  Occupational History  . Not on file  Social Needs  . Financial resource strain: Not on file  . Food insecurity:    Worry: Not on file    Inability: Not on file  . Transportation needs:    Medical: Not on file    Non-medical: Not on file  Tobacco Use  . Smoking status: Never Smoker  . Smokeless tobacco: Never Used  Substance and Sexual Activity  . Alcohol use: Yes    Comment: occ  . Drug use: No  . Sexual activity: Yes  Partners: Female    Birth control/protection: None  Lifestyle  . Physical activity:    Days per week: Not on file    Minutes per session: Not on file  . Stress: Not on file  Relationships  . Social connections:    Talks on phone: Not on file    Gets together: Not on file    Attends religious service: Not on file    Active member of club or organization: Not on file    Attends meetings of clubs or organizations: Not on file    Relationship status: Not on file  . Intimate partner violence:    Fear of current or ex partner: Not on file    Emotionally abused: Not on file    Physically abused: Not on file    Forced sexual activity: Not on file  Other Topics Concern  . Not on file  Social History Narrative  . Not on file   The PMH, PSH, Social History, Family History, Medications, and allergies [facility], and have been updated if  relevant.  Review of Systems  Constitutional: Negative.   HENT: Negative.   Eyes: Negative.   Respiratory: Negative.   Cardiovascular: Negative.   Gastrointestinal: Negative.   Endocrine: Negative.   Genitourinary: Negative.   Musculoskeletal: Negative.   Skin: Positive for rash.  Allergic/Immunologic: Negative.   Neurological: Negative.   Hematological: Negative.   Psychiatric/Behavioral: Negative.   All other systems reviewed and are negative.      Objective:    BP 122/78 (BP Location: Left Arm, Patient Position: Sitting, Cuff Size: Normal)   Pulse (!) 102   Temp 98.4 F (36.9 C) (Oral)   Ht 5\' 10"  (1.778 m)   Wt 233 lb 3.2 oz (105.8 kg)   SpO2 98%   BMI 33.46 kg/m   Wt Readings from Last 3 Encounters:  11/11/18 233 lb 3.2 oz (105.8 kg)  08/11/18 239 lb 3.2 oz (108.5 kg)  05/12/18 242 lb 9.6 oz (110 kg)    Physical Exam Vitals signs and nursing note reviewed.  Constitutional:      Appearance: Normal appearance.  HENT:     Head: Normocephalic and atraumatic.     Nose: Nose normal.     Mouth/Throat:     Mouth: Mucous membranes are moist.  Cardiovascular:     Rate and Rhythm: Normal rate and regular rhythm.  Pulmonary:     Effort: Pulmonary effort is normal.     Breath sounds: Normal breath sounds.  Musculoskeletal: Normal range of motion.     Right lower leg: No edema.     Left lower leg: No edema.  Skin:    General: Skin is warm and dry.  Neurological:     General: No focal deficit present.     Mental Status: He is alert.  Psychiatric:        Mood and Affect: Mood normal.        Behavior: Behavior normal.        Thought Content: Thought content normal.        Judgment: Judgment normal.           Assessment & Plan:   BMI 35.0-35.9,adult - Plan: Comprehensive metabolic panel  Onychomycosis No follow-ups on file.

## 2018-11-11 NOTE — Assessment & Plan Note (Signed)
Discussed weight loss plan. He would like to continue phentermine for now.  He is aware of risk benefits, side effects including HTN, pulmonary HTN, stroke.    She would like to start phentermine and lifestyle changes.  Follow up in 1 month.  If BMI < 27 will decrease to half dose x 1 month then stop

## 2018-11-11 NOTE — Assessment & Plan Note (Signed)
Deteriorated. Discussed preventative care.  Lamasil eRx refill sent. Check CMET today. Call or return to clinic prn if these symptoms worsen or fail to improve as anticipated. The patient indicates understanding of these issues and agrees with the plan.

## 2018-11-11 NOTE — Patient Instructions (Addendum)
Great to see you. I will call you with your lab results from today and you can view them online.   Happy holidays!   Fungal Nail Infection Fungal nail infection is a common fungal infection of the toenails or fingernails. This condition affects toenails more often than fingernails. More than one nail may be infected. The condition can be passed from person to person (is contagious). What are the causes? This condition is caused by a fungus. Several types of funguses can cause the infection. These funguses are common in moist and warm areas. If your hands or feet come into contact with the fungus, it may get into a crack in your fingernail or toenail and cause the infection. What increases the risk? The following factors may make you more likely to develop this condition:  Being male.  Having diabetes.  Being of older age.  Living with someone who has the fungus.  Walking barefoot in areas where the fungus thrives, such as showers or locker rooms.  Having poor circulation.  Wearing shoes and socks that cause your feet to sweat.  Having athlete's foot.  Having a nail injury or history of a recent nail surgery.  Having psoriasis.  Having a weak body defense system (immune system).  What are the signs or symptoms? Symptoms of this condition include:  A pale spot on the nail.  Thickening of the nail.  A nail that becomes yellow or brown.  A brittle or ragged nail edge.  A crumbling nail.  A nail that has lifted away from the nail bed.  How is this diagnosed? This condition is diagnosed with a physical exam. Your health care provider may take a scraping or clipping from your nail to test for the fungus. How is this treated? Mild infections do not need treatment. If you have significant nail changes, treatment may include:  Oral antifungal medicines. You may need to take the medicine for several weeks or several months, and you may not see the results for a long time.  These medicines can cause side effects. Ask your health care provider what problems to watch for.  Antifungal nail polish and nail cream. These may be used along with oral antifungal medicines.  Laser treatment of the nail.  Surgery to remove the nail. This may be needed for the most severe infections.  Treatment takes a long time, and the infection may come back. Follow these instructions at home: Medicines  Take or apply over-the-counter and prescription medicines only as told by your health care provider.  Ask your health care provider about using over-the-counter mentholated ointment on your nails. Lifestyle   Do not share personal items, such as towels or nail clippers.  Trim your nails often.  Wash and dry your hands and feet every day.  Wear absorbent socks, and change your socks frequently.  Wear shoes that allow air to circulate, such as sandals or canvas tennis shoes. Throw out old shoes.  Wear rubber gloves if you are working with your hands in wet areas.  Do not walk barefoot in shower rooms or locker rooms.  Do not use a nail salon that does not use clean instruments.  Do not use artificial nails. General instructions  Keep all follow-up visits as told by your health care provider. This is important.  Use antifungal foot powder on your feet and in your shoes. Contact a health care provider if: Your infection is not getting better or it is getting worse after several months. This  information is not intended to replace advice given to you by your health care provider. Make sure you discuss any questions you have with your health care provider. Document Released: 11/09/2000 Document Revised: 04/19/2016 Document Reviewed: 05/16/2015 Elsevier Interactive Patient Education  2018 [employer]

## 2018-11-13 ENCOUNTER — Ambulatory Visit: Payer: 59 | Admitting: Family Medicine

## 2018-11-17 ENCOUNTER — Other Ambulatory Visit: Payer: Self-pay

## 2018-11-17 MED ORDER — TERBINAFINE HCL 250 MG PO TABS: 250.0000 mg | ORAL_TABLET | Freq: Every day | ORAL | 0 refills | Status: DC

## 2018-11-24 ENCOUNTER — Ambulatory Visit: Payer: Self-pay | Admitting: *Deleted

## 2018-11-24 NOTE — Telephone Encounter (Signed)
Pt called with c/o's sweating and feeling hot. Just can't cool down. 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 Was sent back home.  He is sweating profusely even after taking a shower.  Had diarrhea today and yesterday, look like water, no blood in stool. Also c/o having a headache that is not going away even after taking something for it. Sons have had the flu this passed weak. Per protocol, pt should be seen in the ED.  He will get his wife to take him sometime today, per pt. Advised to call back for increase in symptoms. [facility].  Reason for Disposition . [1] Drinking very little AND [2] dehydration suspected (e.g., no urine > 12 hours, very dry mouth, very lightheaded) . [1] Fainted > 15 minutes ago AND [2] still feels weak or dizzy  Answer Assessment - Initial Assessment Questions 1. ONSET: "How long were you unconscious?" (minutes) "When did it happen?"     A couple of secs 2. CONTENT: "What happened during period of unconsciousness?" (e.g., seizure activity)      Pt not sure 3. MENTAL STATUS: "Alert and oriented now?" (oriented x 3 = name, month, location)      yes 4. TRIGGER: "What do you think caused the fainting?" "What were you doing just before you fainted?"  (e.g., exercise, sudden standing up, prolonged standing)     Not sure 5. RECURRENT SYMPTOM: "Have you ever passed out before?" If so, ask: "When was the last time?" and "What happened that time?"      no 6. INJURY: "Did you sustain any injury during the fall?"      no 7. CARDIAC SYMPTOMS: "Have you had any of the following symptoms: chest pain, difficulty breathing, palpitations?"     no 8. NEUROLOGIC SYMPTOMS: "Have you had any of the following symptoms: headache, numbness, vertigo, weakness?"     Headache, dizziness and weakness 9. GI SYMPTOMS: "Have you had any of the following symptoms:  abdominal pain, vomiting, diarrhea, blood in stools?"     Diarrhea today and yesterday 10. OTHER SYMPTOMS: "Do you have any other symptoms?"       Sweating perfusely 11. PREGNANCY: "Is there any chance you are pregnant?" "When was your last menstrual period?"       no  Protocols used: [facility]

## 2018-11-24 NOTE — Telephone Encounter (Signed)
FYI: TA-Pt was advised to go to ED for Syncopal episode, diaphoresis, and diarrhea/sons Dx with flu last week/thx dmf

## 2019-02-16 ENCOUNTER — Encounter: Payer: Self-pay | Admitting: Family Medicine

## 2019-02-16 ENCOUNTER — Telehealth (INDEPENDENT_AMBULATORY_CARE_PROVIDER_SITE_OTHER): Payer: 59 | Admitting: Family Medicine

## 2019-02-16 ENCOUNTER — Telehealth: Payer: Self-pay

## 2019-02-16 VITALS — Ht 70.0 in | Wt 236.0 lb

## 2019-02-16 DIAGNOSIS — Z6835 Body mass index (BMI) 35.0-35.9, adult: Secondary | ICD-10-CM | POA: Diagnosis not present

## 2019-02-16 DIAGNOSIS — E669 Obesity, unspecified: Secondary | ICD-10-CM | POA: Diagnosis not present

## 2019-02-16 MED ORDER — PHENTERMINE HCL 37.5 MG PO CAPS: 37.5000 mg | ORAL_CAPSULE | ORAL | 2 refills | Status: DC

## 2019-02-16 NOTE — Progress Notes (Signed)
Patient agrees to Tele-Visit.  He gives me his weight at 236 and states that he will need the Phentermine refilled and feels he is doing well on this.  No other concerns at this time and states "I'm healthy as an Ox."

## 2019-02-16 NOTE — Telephone Encounter (Signed)
I LMOVM that his appointment has been changed to a Tele-Visit as I had LM last week we are trying to keep our healthy patients healthy and there is less chance for exposure if he is at home/said to expect a call at his visit time at 2:40pm and all major insurance companies are waiving the copay for these Tele-Visits/thx dmf

## 2019-02-16 NOTE — Assessment & Plan Note (Signed)
Discussed weight loss plan.  Exercising more now with new job. Pleased with current dose of phentermine.  eRx refills sent. Follow up in 3 months. He is aware of risks of phentermine including HTN, pulmonary HTN and stroke.

## 2019-02-16 NOTE — Progress Notes (Signed)
   TELEPHONE ENCOUNTER   Patient verbally agreed to telephone visit and is aware that copayment and coinsurance may apply. Patient was treated using telemedicine according to accepted telemedicine protocols.  Location of the patient: work  Location of provider: [place]  Names of all persons participating in the telemedicine service and role in the encounter: Ruthe Mannan, MD Jerry Schmidt (patient).  Subjective:   Follow up   HPI   Obesity-  3 month follow up. Last saw him on 11/11/18.  Dose of phentermine increased to 37.5 mg daily at that time.   Denies HA, CP, blurred vision or SOB.  He would like to continue it a bit longer.  It is helping with cravings and he plans to exercise more now with his new job.  He gives me his weight at 236 and states that he will need the Phentermine refilled and feels he is doing well on this.  No other concerns at this time and states "I'm healthy as an Ox."  Wt Readings from Last 3 Encounters:  02/16/19 236 lb (107 kg)  11/11/18 233 lb 3.2 oz (105.8 kg)  08/11/18 239 lb 3.2 oz (108.5 kg)      Patient Active Problem List   Diagnosis Date Noted  . Onychomycosis 11/11/2018  . BMI 35.0-35.9,adult 08/28/2017  . Chronic fatigue 08/28/2017   Social History   Tobacco Use  . Smoking status: Never Smoker  . Smokeless tobacco: Never Used  Substance Use Topics  . Alcohol use: Yes    Comment: occ    Current Outpatient Medications:  .  acetaminophen (TYLENOL) 500 MG tablet, Take 2 tablets (1,000 mg total) by mouth every 8 (eight) hours as needed for moderate pain. Take 2 tabs every 8 hours., Disp: 30 tablet, Rfl: 0 .  cetirizine (ZYRTEC) 10 MG tablet, Take 10 mg by mouth daily., Disp: , Rfl:  .  clotrimazole-betamethasone (LOTRISONE) cream, Apply 1 application topically 2 (two) times daily., Disp: 45 g, Rfl: 3 .  phentermine 37.5 MG capsule, Take 1 capsule (37.5 mg total) by mouth every morning., Disp: 30 capsule, Rfl:  2 .  terbinafine (LAMISIL) 250 MG tablet, Take 1 tablet (250 mg total) by mouth daily., Disp: 90 tablet, Rfl: 0 No Known Allergies  Assessment & Plan:   1. BMI 35.0-35.9,adult     No orders of the defined types were placed in this encounter.  Meds ordered this encounter  Medications  . phentermine 37.5 MG capsule    Sig: Take 1 capsule (37.5 mg total) by mouth every morning.    Dispense:  30 capsule    Refill:  2    Ruthe Mannan, MD 02/16/2019  Time spent with the patient: 10 minutes, spent in obtaining information about his symptoms, reviewing his previous labs, evaluations, and treatments, counseling him about his condition (please see the discussed topics above), and developing a plan to further investigate it; he had a number of questions which I addressed.   [id number] physician/qualified health professional telephone evaluation 5 to 10 minutes [id number] physician/qualified help functional [place] evaluation for 11 to 20 minutes [id number] physician/qualify he will professional telephone evaluation for 21 to 30 minutes

## 2019-04-03 ENCOUNTER — Other Ambulatory Visit: Payer: Self-pay

## 2019-04-03 MED ORDER — PHENTERMINE HCL 37.5 MG PO CAPS: 37.5000 mg | ORAL_CAPSULE | ORAL | 2 refills | Status: DC

## 2019-04-03 NOTE — Telephone Encounter (Signed)
TA-I talked to Pharmacist at [facility] who states that they never received the Phentermine on 3.23.20 for #30+2/I have prepared and pended to resent/pt is also scheduling a follow-up appointment late June/thx dmf

## 2019-05-25 ENCOUNTER — Ambulatory Visit: Payer: 59 | Admitting: Family Medicine

## 2019-05-31 NOTE — Progress Notes (Signed)
Subjective:   Patient ID: Jerry Schmidt, male    DOB: 03/17/1981, 38 y.o.   MRN: [id number]  Jerry Schmidt is a pleasant 38 y.o. year old male who presents to clinic today with Medication Refill (phentermine)  on 06/01/2019  HPI:  Obesity-  3 month follow up. Last saw him on 11/11/18.  Phentermine increased to 37.5 mg daily at that time.  Denies HA, CP, blurred vision or SOB.He would like to continue it a bit longer. It is helping with cravings and he plans to exercise more now with his new job.  He gives me his weight at 235 (and he came from work with heavy shoes), and states that he will need the Phentermine refilled and feels he is doing well on this.   Current Outpatient Medications on File Prior to Visit  Medication Sig Dispense Refill  . acetaminophen (TYLENOL) 500 MG tablet Take 2 tablets (1,000 mg total) by mouth every 8 (eight) hours as needed for moderate pain. Take 2 tabs every 8 hours. 30 tablet 0  . cetirizine (ZYRTEC) 10 MG tablet Take 10 mg by mouth daily.    . clotrimazole-betamethasone (LOTRISONE) cream Apply 1 application topically 2 (two) times daily. 45 g 3  . terbinafine (LAMISIL) 250 MG tablet Take 1 tablet (250 mg total) by mouth daily. 90 tablet 0  . [DISCONTINUED] fluticasone (FLONASE) 50 MCG/ACT nasal spray Place 2 sprays into the nose daily. 16 g 0   No current facility-administered medications on file prior to visit.     No Known Allergies  Past Medical History:  Diagnosis Date  . Allergy   . Back injury 02/25/2014  . Pollen allergies   . Seasonal allergies     Past Surgical History:  Procedure Laterality Date  . 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 Depression Mother   . Diabetes Mother   . Drug abuse Mother   . Hyperlipidemia Mother   . Hypertension Mother   . Hyperlipidemia Father   . Hypertension Father   . Drug abuse Brother   .  Diabetes Maternal Grandmother   . Drug abuse Maternal Grandmother     Social History   Socioeconomic History  . Marital status: Married    Spouse name: Not on file  . Number of children: Not on file  . Years of education: Not on file  . Highest education level: Not on file  Occupational History  . Not on file  Social Needs  . Financial resource strain: Not on file  . Food insecurity    Worry: Not on file    Inability: Not on file  . Transportation needs    Medical: Not on file    Non-medical: Not on file  Tobacco Use  . Smoking status: Never Smoker  . Smokeless tobacco: Never Used  Substance and Sexual Activity  . Alcohol use: Yes    Comment: occ  . Drug use: No  . Sexual activity: Yes    Partners: Female    Birth control/protection: None  Lifestyle  . Physical activity    Days per week: Not on file    Minutes per session: Not on file  . Stress: Not on file  Relationships  . Social Herbalist on phone: Not on file    Gets together: Not on file    Attends religious service: Not  on file    Active member of club or organization: Not on file    Attends meetings of clubs or organizations: Not on file    Relationship status: Not on file  . Intimate partner violence    Fear of current or ex partner: Not on file    Emotionally abused: Not on file    Physically abused: Not on file    Forced sexual activity: Not on file  Other Topics Concern  . Not on file  Social History Narrative  . Not on file   The PMH, PSH, Social History, Family History, Medications, and allergies have been reviewed in [facility], and have been updated if relevant.   Review of Systems  Constitutional: Negative.   HENT: Negative.   Eyes: Negative.   Respiratory: Negative.   Cardiovascular: Negative.   Gastrointestinal: Negative.   Endocrine: Negative.   Genitourinary: Negative.   Musculoskeletal: Negative.   Allergic/Immunologic: Negative.   Neurological: Negative.   Hematological:  Negative.   Psychiatric/Behavioral: Negative.   All other systems reviewed and are negative.      Objective:    BP 118/78   Pulse 92   Temp 98 F (36.7 C) (Oral)   Ht 5\' 10"  (1.778 m)   Wt 235 lb (106.6 kg)   SpO2 97%   BMI 33.72 kg/m   Wt Readings from Last 3 Encounters:  06/01/19 235 lb (106.6 kg)  02/16/19 236 lb (107 kg)  11/11/18 233 lb 3.2 oz (105.8 kg)     Physical Exam  General:  pleasant male in no acute distress Eyes:  PERRL Ears:  External ear exam shows no significant lesions or deformities.  TMs normal bilaterally Hearing is grossly normal bilaterally. Nose:  External nasal examination shows no deformity or inflammation. Nasal mucosa are pink and moist without lesions or exudates. Mouth:  Oral mucosa and oropharynx without lesions or exudates.  Teeth in good repair. Neck:  no carotid bruit or thyromegaly no cervical or supraclavicular lymphadenopathy  Lungs:  Normal respiratory effort, chest expands symmetrically. Lungs are clear to auscultation, no crackles or wheezes. Heart:  Normal rate and regular rhythm. S1 and S2 normal without gallop, murmur, click, rub or other extra sounds. Abdomen:  Bowel sounds positive,abdomen soft and non-tender without masses, organomegaly or hernias noted. Pulses:  R and L posterior tibial pulses are full and equal bilaterally  Extremities:  no edema  Psych:  Good eye contact, not anxious or depressed appearing

## 2019-06-01 ENCOUNTER — Encounter: Payer: Self-pay | Admitting: Family Medicine

## 2019-06-01 ENCOUNTER — Ambulatory Visit (INDEPENDENT_AMBULATORY_CARE_PROVIDER_SITE_OTHER): Payer: 59 | Admitting: Family Medicine

## 2019-06-01 ENCOUNTER — Telehealth: Payer: Self-pay | Admitting: Behavioral Health

## 2019-06-01 VITALS — BP 118/78 | HR 92 | Temp 98.0°F | Ht 70.0 in | Wt 235.0 lb

## 2019-06-01 DIAGNOSIS — E669 Obesity, unspecified: Secondary | ICD-10-CM

## 2019-06-01 DIAGNOSIS — E66811 Obesity, class 1: Secondary | ICD-10-CM | POA: Insufficient documentation

## 2019-06-01 MED ORDER — PHENTERMINE HCL 37.5 MG PO CAPS: 37.5000 mg | ORAL_CAPSULE | ORAL | 2 refills | Status: DC

## 2019-06-01 NOTE — Assessment & Plan Note (Signed)
Discussed weight loss plan.  VSS.  Pt is aware of phentermine risk benefits, side effects including HTN, pulmonary HTN, stroke.    He would like to continue phentermine and lifestyle changes.  Follow up in 3 months.    If BMI < 27 will decrease to half dose x 1 month then stop

## 2019-06-01 NOTE — Patient Instructions (Addendum)
Great to see you.  Let me know how things turn out at work.

## 2019-06-01 NOTE — Telephone Encounter (Signed)
Unable to reach patient at time of call to complete prescreening questionnaire. Left message for patient to return call when available.

## 2019-07-11 ENCOUNTER — Encounter (HOSPITAL_BASED_OUTPATIENT_CLINIC_OR_DEPARTMENT_OTHER): Payer: Self-pay | Admitting: Emergency Medicine

## 2019-07-11 ENCOUNTER — Other Ambulatory Visit: Payer: Self-pay

## 2019-07-11 ENCOUNTER — Emergency Department (HOSPITAL_BASED_OUTPATIENT_CLINIC_OR_DEPARTMENT_OTHER): Payer: Worker's Compensation

## 2019-07-11 ENCOUNTER — Emergency Department (HOSPITAL_BASED_OUTPATIENT_CLINIC_OR_DEPARTMENT_OTHER)
Admission: EM | Admit: 2019-07-11 | Discharge: 2019-07-11 | Disposition: A | Discharge status: Home / Self Care | Payer: Worker's Compensation | Attending: Emergency Medicine | Admitting: Emergency Medicine

## 2019-07-11 DIAGNOSIS — Z23 Encounter for immunization: Secondary | ICD-10-CM | POA: Diagnosis not present

## 2019-07-11 DIAGNOSIS — S62639B Displaced fracture of distal phalanx of unspecified finger, initial encounter for open fracture: Secondary | ICD-10-CM

## 2019-07-11 DIAGNOSIS — S61317A Laceration without foreign body of left little finger with damage to nail, initial encounter: Secondary | ICD-10-CM | POA: Insufficient documentation

## 2019-07-11 DIAGNOSIS — Y929 Unspecified place or not applicable: Secondary | ICD-10-CM | POA: Diagnosis not present

## 2019-07-11 DIAGNOSIS — W311XXA Contact with metalworking machines, initial encounter: Secondary | ICD-10-CM | POA: Insufficient documentation

## 2019-07-11 DIAGNOSIS — Y9389 Activity, other specified: Secondary | ICD-10-CM | POA: Diagnosis not present

## 2019-07-11 DIAGNOSIS — Y990 Civilian activity done for income or pay: Secondary | ICD-10-CM | POA: Diagnosis not present

## 2019-07-11 DIAGNOSIS — S62637B Displaced fracture of distal phalanx of left little finger, initial encounter for open fracture: Secondary | ICD-10-CM | POA: Insufficient documentation

## 2019-07-11 MED ORDER — LIDOCAINE HCL (PF) 1 % IJ SOLN
5.0000 mL | Freq: Once | INTRAMUSCULAR | Status: AC
  Administered 2019-07-11: 5 mL
  Filled 2019-07-11: qty 5

## 2019-07-11 MED ORDER — TETANUS-DIPHTH-ACELL PERTUSSIS 5-2.5-18.5 LF-MCG/0.5 IM SUSP
0.5000 mL | Freq: Once | INTRAMUSCULAR | Status: AC
  Administered 2019-07-11: 0.5 mL via INTRAMUSCULAR
  Filled 2019-07-11: qty 0.5

## 2019-07-11 MED ORDER — CEPHALEXIN 500 MG PO CAPS: 500.0000 mg | ORAL_CAPSULE | Freq: Four times a day (QID) | ORAL | 0 refills | Status: DC

## 2019-07-11 NOTE — ED Provider Notes (Signed)
[facility] EMERGENCY DEPARTMENT Provider Note   CSN: [id number] Arrival date & time: 07/11/19  4431    History   Chief Complaint Chief Complaint  Patient presents with  . Finger Injury    HPI Jerry Schmidt is a 38 y.o. male.     HPI Patient presents to the ED for evaluation of a finger injury.  Patient was working with a saw when a piece of metal kicked up and hit his left pinky finger.  It caused a laceration to the tip of his finger as well as an injury to his nail.  Patient was unable to remove the nail and was not sure if he required sutures so he came to the ED.  He denies any numbness or weakness.  He denies any other injuries. Past Medical History:  Diagnosis Date  . Back injury 02/25/2014  . Seasonal allergies     Patient Active Problem List   Diagnosis Date Noted  . Obesity (BMI 30.0-34.9) 06/01/2019    Past Surgical History:  Procedure Laterality Date  . 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 Authorizing Provider  acetaminophen (TYLENOL) 500 MG tablet Take 2 tablets (1,000 mg total) by mouth every 8 (eight) hours as needed for moderate pain. Take 2 tabs every 8 hours. 11/01/14  Yes Tereasa Coop, PA-C  cetirizine (ZYRTEC) 10 MG tablet Take 10 mg by mouth daily.   Yes [provider]  phentermine 37.5 MG capsule Take 1 capsule (37.5 mg total) by mouth every morning. 06/01/19  Yes Lucille Passy, MD  cephALEXin (KEFLEX) 500 MG capsule Take 1 capsule (500 mg total) by mouth 4 (four) times daily. 07/11/19   Dorie Rank, MD  clotrimazole-betamethasone (LOTRISONE) cream Apply 1 application topically 2 (two) times daily. 04/14/18   Lucille Passy, MD  terbinafine (LAMISIL) 250 MG tablet Take 1 tablet (250 mg total) by mouth daily. 11/17/18   Lucille Passy, MD  fluticasone Asencion Islam) 50 MCG/ACT nasal spray Place 2  sprays into the nose daily. 03/18/13 12/07/13  Moreno-Coll, Adlih, MD    Family History Family History  Problem Relation Age of Onset  . Depression Mother   . Diabetes Mother   . Drug abuse Mother   . Hyperlipidemia Mother   . Hypertension Mother   . Hyperlipidemia Father   . Hypertension Father   . Drug abuse Brother   . Diabetes Maternal Grandmother   . Drug abuse Maternal Grandmother     Social History Social History   Tobacco Use  . Smoking status: Never Smoker  . Smokeless tobacco: Never Used  Substance Use Topics  . Alcohol use: Yes    Comment: occ  . Drug use: No     Allergies   Patient has no known allergies.   Review of Systems Review of Systems  All other systems reviewed and are negative.    Physical Exam Updated Vital Signs BP 122/86   Pulse 83   Temp 98.2 F (36.8 C) (Oral)   Resp 18   Ht 1.803 m (5\' 11" )   Wt 106.6 kg   SpO2 100%   BMI 32.78 kg/m   Physical Exam Vitals signs and nursing note reviewed.  Constitutional:      General: He is not in acute distress.    Appearance:  He is well-developed.  HENT:     Head: Normocephalic and atraumatic.     Right Ear: External ear normal.     Left Ear: External ear normal.  Eyes:     General: No scleral icterus.       Right eye: No discharge.        Left eye: No discharge.     Conjunctiva/sclera: Conjunctivae normal.  Neck:     Musculoskeletal: Neck supple.     Trachea: No tracheal deviation.  Cardiovascular:     Rate and Rhythm: Normal rate.  Pulmonary:     Effort: Pulmonary effort is normal. No respiratory distress.     Breath sounds: No stridor.  Abdominal:     General: There is no distension.  Musculoskeletal:        General: No swelling or deformity.     Comments: Pinky finger with superficial laceration  approx 5 mmdistal apsect small finger, second laceration through nail bed, partial distal nail avulsion   Skin:    General: Skin is warm and dry.     Findings: No rash.   Neurological:     Mental Status: He is alert.     Cranial Nerves: Cranial nerve deficit: no gross deficits.      ED Treatments / Results  Labs (all labs ordered are listed, but only abnormal results are displayed) Labs Reviewed - No data to display  EKG None  Radiology Dg Finger Little Left  Result Date: 07/11/2019 CLINICAL DATA:  Left little finger injury and laceration on saw. Initial encounter. EXAM: LEFT LITTLE FINGER 2+V COMPARISON:  None. FINDINGS: Nondisplaced fracture is seen through the terminal tuft of the distal phalanx. No other fractures identified. No evidence of dislocation. No radiopaque foreign body identified. IMPRESSION: Nondisplaced fracture through the terminal tuft of the distal phalanx. Electronically Signed   By: Danae OrleansJohn A Stahl M.D.   On: 07/11/2019 08:07    Procedures .Marland Kitchen.Laceration Repair  Date/Time: 07/11/2019 8:29 AM Performed by: Linwood DibblesKnapp, Kathleen Likins, MD Authorized by: Linwood DibblesKnapp, Rethel Sebek, MD   Consent:    Consent obtained:  Verbal   Consent given by:  Patient   Risks discussed:  Infection, need for additional repair, pain, poor cosmetic result and poor wound healing   Alternatives discussed:  No treatment and delayed treatment Universal protocol:    Procedure explained and questions answered to patient or proxy's satisfaction: yes     Relevant documents present and verified: yes     Test results available and properly labeled: yes     Imaging studies available: yes     Required blood products, implants, devices, and special equipment available: yes     Site/side marked: yes     Immediately prior to procedure, a time out was called: yes     Patient identity confirmed:  Verbally with patient Anesthesia (see MAR for exact dosages):    Anesthesia method:  Local infiltration   Local anesthetic:  Lidocaine 1% w/o epi Laceration details:    Location:  Finger   Finger location:  L small finger   Length (cm):  1 Repair type:    Repair type:  Simple Pre-procedure  details:    Preparation:  Patient was prepped and draped in usual sterile fashion Exploration:    Wound exploration: entire depth of wound probed and visualized     Wound extent: underlying fracture     Contaminated: no   Treatment:    Area cleansed with:  Shur-Clens   Amount of cleaning:  Extensive  Irrigation method:  Pressure wash   Visualized foreign bodies/material removed: no   Approximation:    Approximation:  Close Post-procedure details:    Dressing:  Antibiotic ointment and splint for protection   Patient tolerance of procedure:  Tolerated well, no immediate complications Comments:     1 suture placed with 4-0 vicryl rapide through nail and nail matrix   (including critical care time)  Medications Ordered in ED Medications  Tdap (BOOSTRIX) injection 0.5 mL (0.5 mLs Intramuscular Given 07/11/19 0719)  lidocaine (PF) (XYLOCAINE) 1 % injection 5 mL (5 mLs Infiltration Given by Other 07/11/19 [id number])     Initial Impression / Assessment and Plan / ED Course  I have reviewed the triage vital signs and the nursing notes.  Pertinent labs & imaging results that were available during my care of the patient were reviewed by me and considered in my medical decision making (see chart for details).   Xrays show a tuft fracture.  Pt did have a laceration that involved the nail matrix, distal third of the nail.   Suture placed through nail and tissue to stabilize wound after thorough wound cleaning.  Dc home with splint, abx.  Follow up ortho  Final Clinical Impressions(s) / ED Diagnoses   Final diagnoses:  Laceration of left little finger without foreign body with damage to nail, initial encounter  Open fracture of tuft of distal phalanx of finger    ED Discharge Orders         Ordered    cephALEXin (KEFLEX) 500 MG capsule  4 times daily     07/11/19 0827           Linwood DibblesKnapp, Ernest Orr, MD 07/11/19 0830

## 2019-07-11 NOTE — Discharge Instructions (Addendum)
The xray showed a fracture through the distal tip of the finger, continue to keep the wound covered and keep a splint on the finger.  The wound should heal over the next couple of weeks.  The fracture will take 1-2 months to heal.  Call to schedule an appointment to Follow up with the orthopedic doctor to make sure it is healing properly

## 2019-07-11 NOTE — ED Triage Notes (Signed)
Pt here after piece of metal kicked up from a saw hit left pinky and caused laceration and partial separation of nail. Bleeding is controlled in triage.

## 2019-09-11 IMAGING — CT CT ABD-PELV W/ CM
2 of 4 series · 16 of 46 positions shown, 18 images · IV contrast (Omni 300)
Comparison: 04/29/2015

CLINICAL DATA: Back pain radiating to the abdomen. Patient is
unable to urinate. History of renal stones.

EXAM:
CT ABDOMEN AND PELVIS WITH CONTRAST
TECHNIQUE: Multidetector CT imaging of the abdomen and pelvis was performed
using the standard protocol following bolus administration of
intravenous contrast.
CONTRAST:  100mL 5Q7YN1-SWW IOPAMIDOL ([id number]-[id number]) INJECTION 61%

[Series 3: a/p w/ 5mm · axial · 0.89mm/px · z∈[+671,+1151]mm · 13 of 109 slices shown, 15 images]
[im 7/109  soft-tissue]
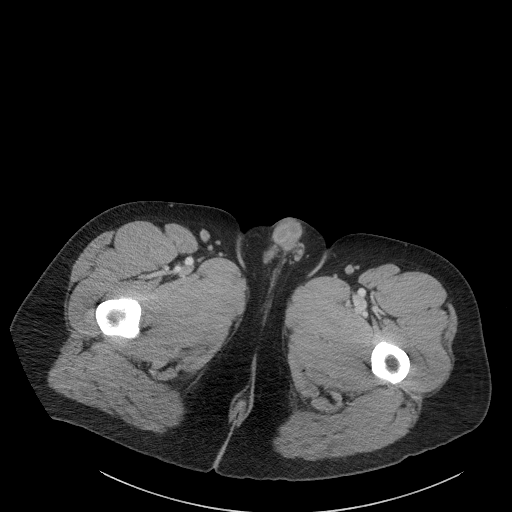
[im 7/109  bone]
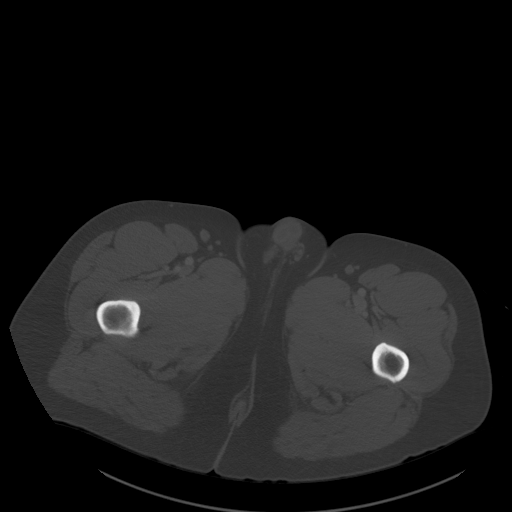
[im 13/109  soft-tissue]
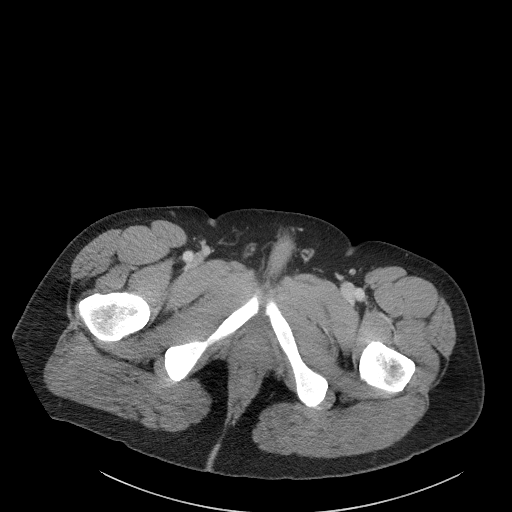
[im 25/109  soft-tissue]
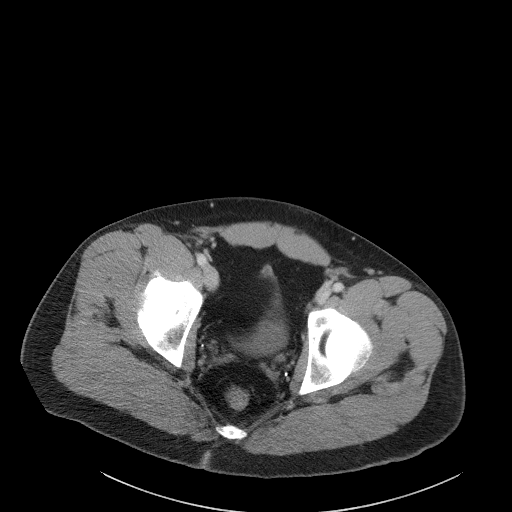
[im 31/109  soft-tissue]
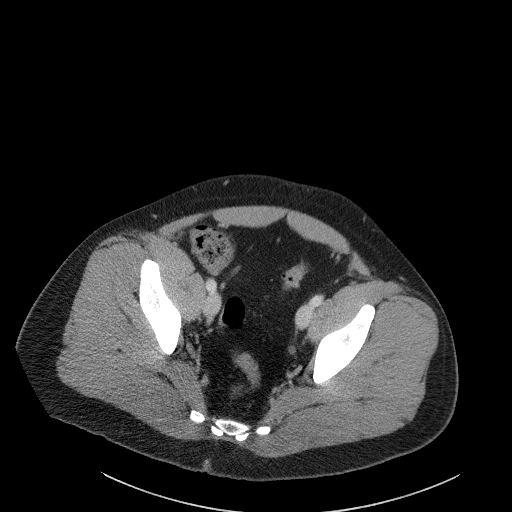
[im 37/109  soft-tissue]
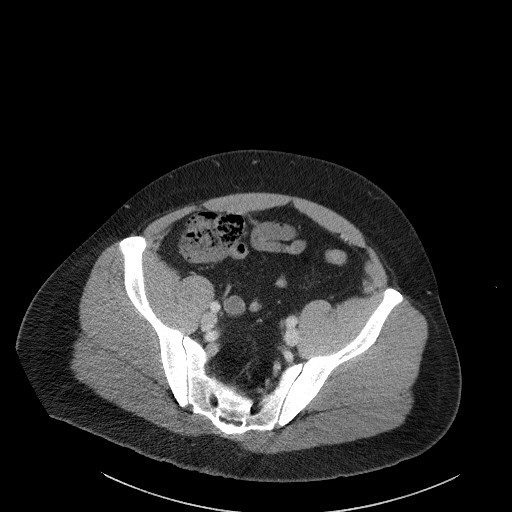
[im 49/109  soft-tissue]
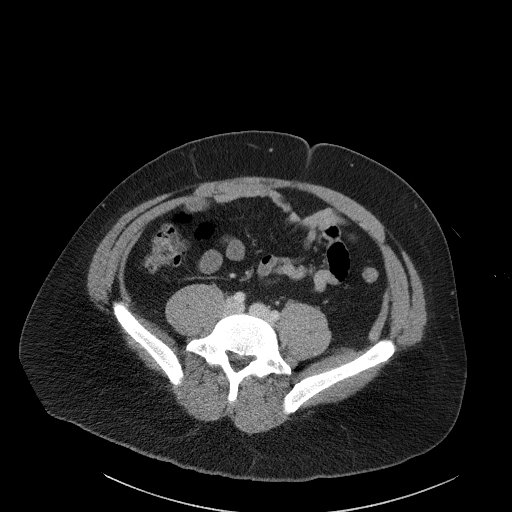
[im 55/109  soft-tissue]
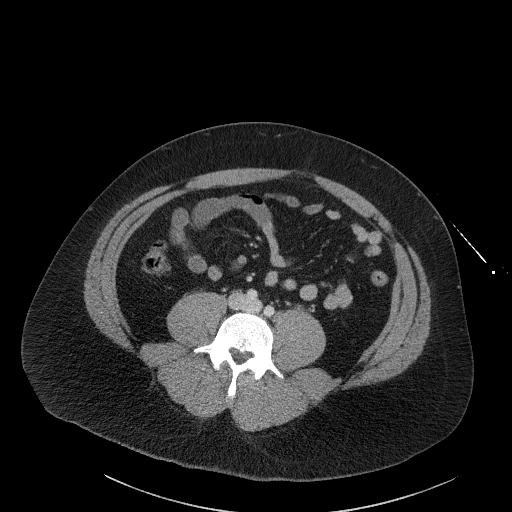
[im 61/109  soft-tissue]
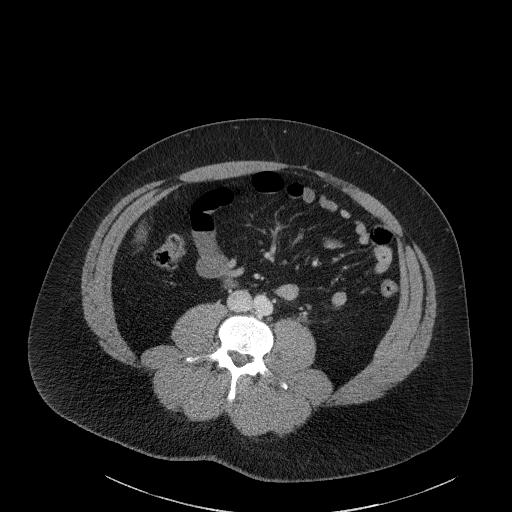
[im 73/109  soft-tissue]
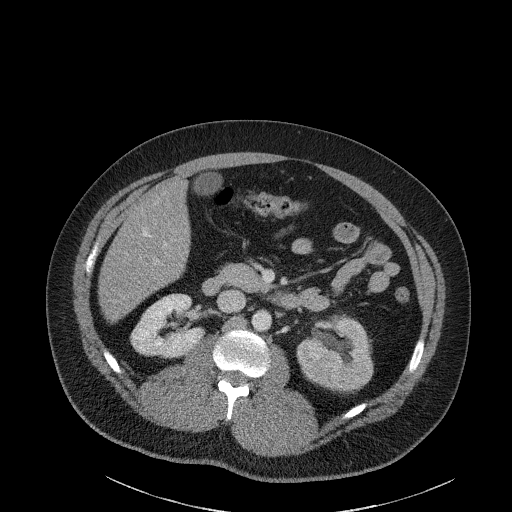
[im 73/109  bone]
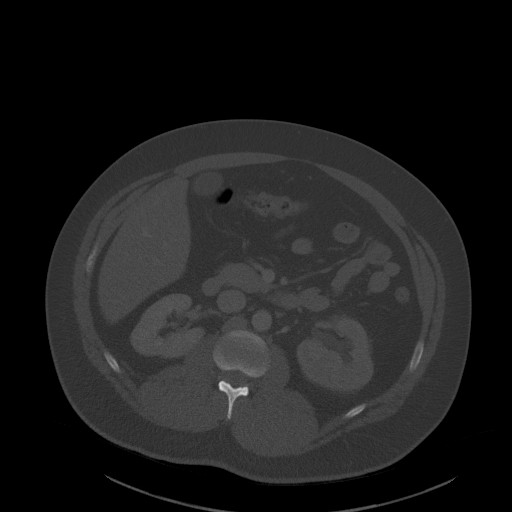
[im 79/109  soft-tissue]
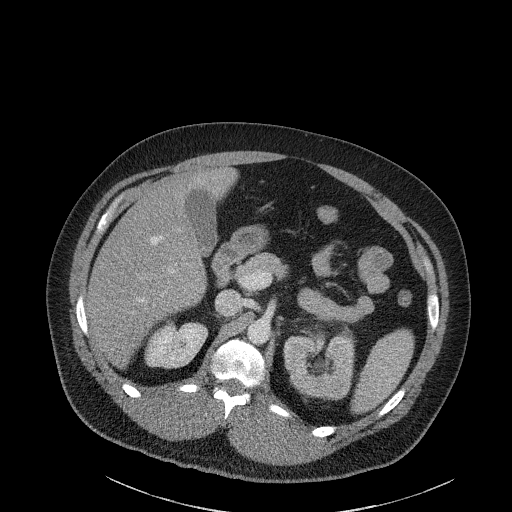
[im 85/109  soft-tissue]
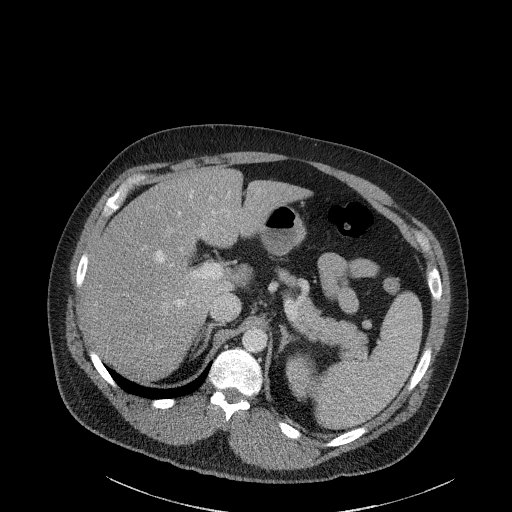
[im 97/109  soft-tissue]
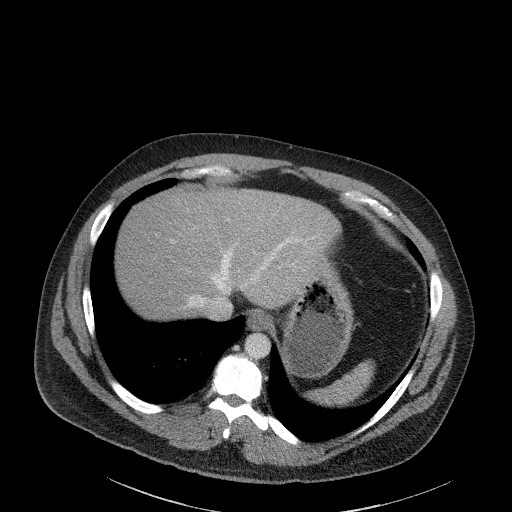
[im 103/109  soft-tissue]
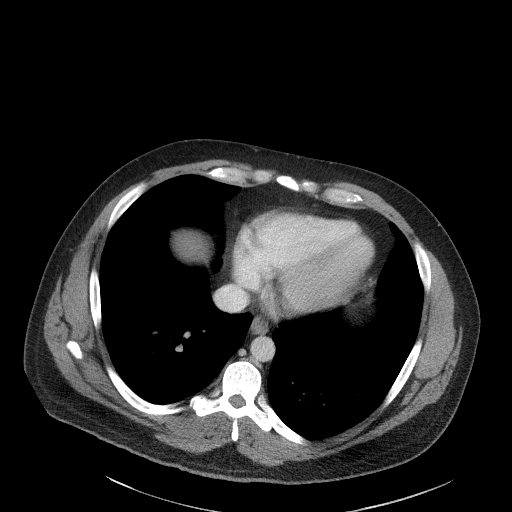

[Series 6: a/p w/ cor · coronal · 0.76mm/px · 3 of 151 slices shown]
[im 51/151  soft-tissue]
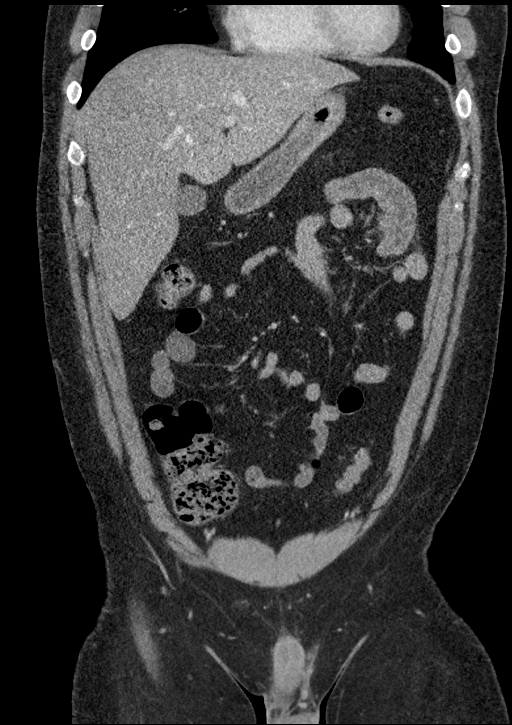
[im 67/151  soft-tissue]
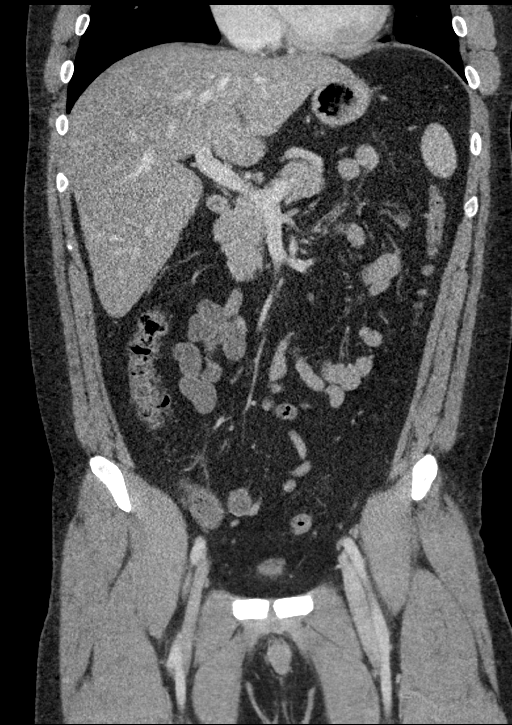
[im 84/151  soft-tissue]
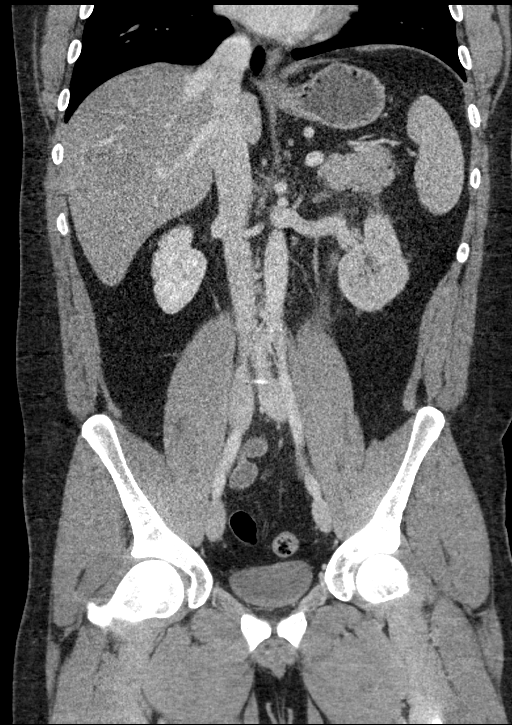

[16 of 46 positions shown; findings below may reference images not displayed]

FINDINGS: Lower chest: No acute abnormality.

Hepatobiliary: Hepatic steatosis. Unremarkable gallbladder. No
biliary dilatation. No space-occupying mass of the liver.

Pancreas: Normal

Spleen: Normal

Adrenals/Urinary Tract: 2 mm left ureterovesical junction stone
almost having passed into the bladder is noted. This accounts for
mild left-sided hydroureteronephrosis with perinephric fat
stranding. Normal right kidney and renal collecting system. Normal
bilateral adrenal glands.

Stomach/Bowel: Stomach is within normal limits. Appendix appears
normal. No evidence of bowel wall thickening, distention, or
inflammatory changes.

Vascular/Lymphatic: No significant vascular findings are present. No
enlarged abdominal or pelvic lymph nodes.

Reproductive: Prostate is unremarkable.

Other: No abdominal wall hernia or abnormality. No abdominopelvic
ascites.

Musculoskeletal: No acute or significant osseous findings.
IMPRESSION: 1. 2 mm left ureterovesical junction stone causing mild left-sided
hydroureteronephrosis.
2. Hepatic steatosis.

## 2019-12-04 ENCOUNTER — Encounter: Payer: Self-pay | Admitting: Family Medicine

## 2019-12-04 ENCOUNTER — Telehealth (INDEPENDENT_AMBULATORY_CARE_PROVIDER_SITE_OTHER): Payer: 59 | Admitting: Family Medicine

## 2019-12-04 ENCOUNTER — Ambulatory Visit: Payer: 59 | Attending: Internal Medicine

## 2019-12-04 VITALS — Ht 71.0 in

## 2019-12-04 DIAGNOSIS — Z20822 Contact with and (suspected) exposure to covid-19: Secondary | ICD-10-CM

## 2019-12-04 NOTE — Progress Notes (Signed)
Jerry Schmidt - 39 y.o. male MRN [id number]  Date of birth: 1981-07-10   This visit type was conducted due to national recommendations for restrictions regarding the COVID-19 Pandemic (e.g. social distancing).  This format is felt to be most appropriate for this patient at this time.  All issues noted in this document were discussed and addressed.  No physical exam was performed (except for noted visual exam findings with Video Visits).  I discussed the limitations of evaluation and management by telemedicine and the availability of in person appointments. The patient expressed understanding and agreed to proceed.  I connected with@ on 12/04/19 at  8:00 AM EST by a video enabled telemedicine application and verified that I am speaking with the correct person using two identifiers.  Present at visit: Jerry Coombe, Jerry Schmidt Lorn Junes   Patient Location: Home [address] [place] [address]   Provider location:   [facility] [place]  Chief Complaint  Patient presents with  . Dizziness    pt exposed to covid last week. Pt started to feel dizzy two days ago.    HPI  Jerry Schmidt is a 39 y.o. male who presents via audio/video conferencing for a telehealth visit today.  Reports exposure to COVID last week, also earlier this week his wife was exposed.  Had initial negative test this past Monday however began having feelings of dizziness and flushing the following day.  Denies respiratory symptoms, fever, chills, nausea, vomiting, diarrhea, body aches or headache. He has been quarantining and is planning on getting second test today with possible new exposure. He is requesting letter to return back to work if this test returns negative.    ROS:  A comprehensive ROS was completed and negative except as noted per HPI  Past Medical History:  Diagnosis Date  . Back injury 02/25/2014  . Seasonal allergies     Past Surgical History:  Procedure Laterality Date  .  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 Depression Mother   . Diabetes Mother   . Drug abuse Mother   . Hyperlipidemia Mother   . Hypertension Mother   . Hyperlipidemia Father   . Hypertension Father   . Drug abuse Brother   . Diabetes Maternal Grandmother   . Drug abuse Maternal Grandmother     Social History   Socioeconomic History  . Marital status: Married    Spouse name: Not on file  . Number of children: Not on file  . Years of education: Not on file  . Highest education level: Not on file  Occupational History  . Not on file  Tobacco Use  . Smoking status: Never Smoker  . Smokeless tobacco: Never Used  Substance and Sexual Activity  . Alcohol use: Yes    Comment: occ  . Drug use: No  . Sexual activity: Yes    Partners: Female    Birth control/protection: None  Other Topics Concern  . Not on file  Social History Narrative  . Not on file   Social Determinants of Health   Financial Resource Strain:   . Difficulty of Paying Living Expenses: Not on file  Food Insecurity:   . Worried About Programme researcher, broadcasting/film/video in the Last Year: Not on file  . Ran Out of Food in the Last Year: Not on file  Transportation Needs:   . Lack of  Transportation (Medical): Not on file  . Lack of Transportation (Non-Medical): Not on file  Physical Activity:   . Days of Exercise per Week: Not on file  . Minutes of Exercise per Session: Not on file  Stress:   . Feeling of Stress : Not on file  Social Connections:   . Frequency of Communication with Friends and Family: Not on file  . Frequency of Social Gatherings with Friends and Family: Not on file  . Attends Religious Services: Not on file  . Active Member of Clubs or Organizations: Not on file  . Attends Archivist Meetings: Not on file  . Marital Status: Not on file  Intimate Partner Violence:   . Fear of Current or Ex-Partner: Not on  file  . Emotionally Abused: Not on file  . Physically Abused: Not on file  . Sexually Abused: Not on file     Current Outpatient Medications:  .  acetaminophen (TYLENOL) 500 MG tablet, Take 2 tablets (1,000 mg total) by mouth every 8 (eight) hours as needed for moderate pain. Take 2 tabs every 8 hours., Disp: 30 tablet, Rfl: 0 .  cephALEXin (KEFLEX) 500 MG capsule, Take 1 capsule (500 mg total) by mouth 4 (four) times daily., Disp: 28 capsule, Rfl: 0 .  cetirizine (ZYRTEC) 10 MG tablet, Take 10 mg by mouth daily., Disp: , Rfl:  .  clotrimazole-betamethasone (LOTRISONE) cream, Apply 1 application topically 2 (two) times daily., Disp: 45 g, Rfl: 3 .  phentermine 37.5 MG capsule, Take 1 capsule (37.5 mg total) by mouth every morning., Disp: 30 capsule, Rfl: 2 .  terbinafine (LAMISIL) 250 MG tablet, Take 1 tablet (250 mg total) by mouth daily., Disp: 90 tablet, Rfl: 0  EXAM:  VITALS per patient if applicable: Ht 5\' 11"  (1.803 m)   BMI 32.78 kg/m   GENERAL: alert, oriented, appears well and in no acute distress  HEENT: atraumatic, conjunttiva clear, no obvious abnormalities on inspection of external nose and ears  NECK: normal movements of the head and neck  LUNGS: on inspection no signs of respiratory distress, breathing rate appears normal, no obvious gross SOB, gasping or wheezing  CV: no obvious cyanosis  MS: moves all visible extremities without noticeable abnormality  PSYCH/NEURO: pleasant and cooperative, no obvious depression or anxiety, speech and thought processing grossly intact  ASSESSMENT AND PLAN:  Discussed the following assessment and plan:  Exposure to COVID-19 virus Mild flushing and dizziness seems to have resolved, discussed I think this is unlikely related to COVID given absence of any other symptoms.  He will get retested again prior to return to work.  Discussed that he should let us know if any additional symptoms develop.      I discussed the  assessment and treatment plan with the patient. The patient was provided an opportunity to ask questions and all were answered. The patient agreed with the plan and demonstrated an understanding of the instructions.   The patient was advised to call back or seek an in-person evaluation if the symptoms worsen or if the condition fails to improve as anticipated.    Luetta Nutting, Jerry Schmidt

## 2019-12-04 NOTE — Assessment & Plan Note (Signed)
Mild flushing and dizziness seems to have resolved, discussed I think this is unlikely related to COVID given absence of any other symptoms.  He will get retested again prior to return to work.  Discussed that he should let us know if any additional symptoms develop.

## 2019-12-06 LAB — NOVEL CORONAVIRUS, NAA: SARS-CoV-2, NAA: NOT DETECTED

## 2019-12-30 ENCOUNTER — Ambulatory Visit (INDEPENDENT_AMBULATORY_CARE_PROVIDER_SITE_OTHER): Payer: 59 | Admitting: Podiatry

## 2019-12-30 ENCOUNTER — Other Ambulatory Visit: Payer: Self-pay

## 2019-12-30 ENCOUNTER — Encounter: Payer: Self-pay | Admitting: Podiatry

## 2019-12-30 ENCOUNTER — Ambulatory Visit (INDEPENDENT_AMBULATORY_CARE_PROVIDER_SITE_OTHER): Payer: 59

## 2019-12-30 DIAGNOSIS — M79672 Pain in left foot: Secondary | ICD-10-CM | POA: Diagnosis not present

## 2019-12-30 DIAGNOSIS — M722 Plantar fascial fibromatosis: Secondary | ICD-10-CM

## 2019-12-30 MED ORDER — DICLOFENAC SODIUM 75 MG PO TBEC: 75.0000 mg | DELAYED_RELEASE_TABLET | Freq: Two times a day (BID) | ORAL | 1 refills | Status: DC

## 2019-12-30 MED ORDER — METHYLPREDNISOLONE 4 MG PO TBPK: ORAL_TABLET | ORAL | 0 refills | Status: DC

## 2020-01-01 ENCOUNTER — Other Ambulatory Visit: Payer: Self-pay | Admitting: Podiatry

## 2020-01-01 DIAGNOSIS — M722 Plantar fascial fibromatosis: Secondary | ICD-10-CM

## 2020-01-03 NOTE — Progress Notes (Signed)
   Subjective: 39 y.o. male presenting today with a chief complaint of gradually worsening sharp, throbbing pain in the left heel that began about two months ago. He states the pain starts in the morning time and worsens throughout the day as he walks on concrete floors all day. He has been exercising the foot for treatment with some relief. Patient is here for further evaluation and treatment.   Past Medical History:  Diagnosis Date  . Back injury 02/25/2014  . Seasonal allergies      Objective: Physical Exam General: The patient is alert and oriented x3 in no acute distress.  Dermatology: Skin is warm, dry and supple bilateral lower extremities. Negative for open lesions or macerations bilateral.   Vascular: Dorsalis Pedis and Posterior Tibial pulses palpable bilateral.  Capillary fill time is immediate to all digits.  Neurological: Epicritic and protective threshold intact bilateral.   Musculoskeletal: Tenderness to palpation to the plantar aspect of the left heel along the plantar fascia. All other joints range of motion within normal limits bilateral. Strength 5/5 in all groups bilateral.   Radiographic exam: Normal osseous mineralization. Joint spaces preserved. No fracture/dislocation/boney destruction. No other soft tissue abnormalities or radiopaque foreign bodies.   Assessment: 1. Plantar fasciitis left foot  Plan of Care:  1. Patient evaluated. Xrays reviewed.   2. Injection of 0.5cc Celestone soluspan injected into the left plantar fascia.  3. Rx for Medrol Dose Pak placed 4. Rx for Diclofenac ordered for patient. 5. Plantar fascial band(s) dispensed  6. Instructed patient regarding therapies and modalities at home to alleviate symptoms.  7. Continue stretching and icing daily.  8. Return to clinic in 4 weeks.    Works Set designer at [employer].    Felecia Shelling, DPM [facility]  Dr. Felecia Shelling, DPM    [address] [place], [place] [address]                Office [phone]  Fax [phone]

## 2020-01-27 ENCOUNTER — Other Ambulatory Visit: Payer: Self-pay

## 2020-01-27 ENCOUNTER — Ambulatory Visit (INDEPENDENT_AMBULATORY_CARE_PROVIDER_SITE_OTHER): Payer: 59 | Admitting: Podiatry

## 2020-01-27 DIAGNOSIS — M722 Plantar fascial fibromatosis: Secondary | ICD-10-CM

## 2020-01-30 NOTE — Progress Notes (Signed)
   Subjective: 39 y.o. male presenting today for follow up evaluation of plantar fasciitis of the left foot. He reports worsening pain. He states he has been walking on his toes due to the pain. He has been using the plantar fascial brace and exercising the foot which helps only temporarily. He states the injection helped for about two week. Touching the area increases the pain. Patient is here for further evaluation and treatment.   Past Medical History:  Diagnosis Date  . Back injury 02/25/2014  . Seasonal allergies      Objective: Physical Exam General: The patient is alert and oriented x3 in no acute distress.  Dermatology: Skin is warm, dry and supple bilateral lower extremities. Negative for open lesions or macerations bilateral.   Vascular: Dorsalis Pedis and Posterior Tibial pulses palpable bilateral.  Capillary fill time is immediate to all digits.  Neurological: Epicritic and protective threshold intact bilateral.   Musculoskeletal: Tenderness to palpation to the plantar aspect of the left heel along the plantar fascia. All other joints range of motion within normal limits bilateral. Strength 5/5 in all groups bilateral.   Assessment: 1. Plantar fasciitis left foot  Plan of Care:  1. Patient evaluated.  2. Continue using plantar fascial brace.  3. Declined injection today.  4. Continue taking Diclofenac twice daily.  5. Night splint dispensed.  6. Appointment with Raiford Noble, Pedorthist, for custom molded orthotics.  7. Return to clinic as needed.   Works Set designer at [employer].    Felecia Shelling, DPM [facility]  Dr. Felecia Shelling, DPM    [address] [place], [place] [address]                Office [phone]  Fax [phone]

## 2020-02-10 ENCOUNTER — Other Ambulatory Visit: Payer: 59 | Admitting: Orthotics

## 2020-02-10 ENCOUNTER — Other Ambulatory Visit: Payer: Self-pay

## 2020-02-10 DIAGNOSIS — M722 Plantar fascial fibromatosis: Secondary | ICD-10-CM | POA: Diagnosis not present

## 2020-03-07 ENCOUNTER — Ambulatory Visit: Payer: 59 | Admitting: Orthotics

## 2020-03-07 ENCOUNTER — Other Ambulatory Visit: Payer: Self-pay

## 2020-03-07 DIAGNOSIS — M722 Plantar fascial fibromatosis: Secondary | ICD-10-CM

## 2020-03-07 DIAGNOSIS — M79672 Pain in left foot: Secondary | ICD-10-CM

## 2020-03-07 NOTE — Progress Notes (Signed)
Patient came in today to pick up custom made foot orthotics.  The goals were accomplished and the patient reported no dissatisfaction with said orthotics.  Patient was advised of breakin period and how to report any issues. 

## 2020-08-12 ENCOUNTER — Encounter: Payer: Self-pay | Admitting: Family Medicine

## 2020-08-12 ENCOUNTER — Ambulatory Visit (INDEPENDENT_AMBULATORY_CARE_PROVIDER_SITE_OTHER): Payer: 59 | Admitting: Family Medicine

## 2020-08-12 VITALS — BP 124/82 | HR 77 | Temp 98.1°F | Ht 71.0 in | Wt 245.8 lb

## 2020-08-12 DIAGNOSIS — Z6834 Body mass index (BMI) 34.0-34.9, adult: Secondary | ICD-10-CM

## 2020-08-12 DIAGNOSIS — Z2821 Immunization not carried out because of patient refusal: Secondary | ICD-10-CM

## 2020-08-12 DIAGNOSIS — Z0000 Encounter for general adult medical examination without abnormal findings: Secondary | ICD-10-CM | POA: Diagnosis not present

## 2020-08-12 MED ORDER — PHENTERMINE HCL 37.5 MG PO CAPS: 37.5000 mg | ORAL_CAPSULE | ORAL | 2 refills | Status: DC

## 2020-08-12 NOTE — Progress Notes (Signed)
Jerry Schmidt is a 39 y.o. male  Chief Complaint  Patient presents with  . Establish Care    TOC, CPE, wants to get RX for Phenteramine    HPI: Jerry Schmidt is a 39 y.o. male here for annual CPE, labs.  He has no acute issues or concerns.  Diet/Exercise: pt had lost weight with improved diet and phentermine in the past, but then stopped med and got off track with diet and has gained weight back Dental: UTD Vision: no glasses or contacts  Med refills needed today: see orders  Past Medical History:  Diagnosis Date  . Back injury 02/25/2014  . Seasonal allergies     Past Surgical History:  Procedure Laterality Date  . 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 Marital status: Married    Spouse name: Not on file  . Number of children: Not on file  . Years of education: Not on file  . Highest education level: Not on file  Occupational History  . Not on file  Tobacco Use  . Smoking status: Never Smoker  . Smokeless tobacco: Never Used  Vaping Use  . Vaping Use: Never used  Substance and Sexual Activity  . Alcohol use: Yes    Comment: occ  . Drug use: No  . Sexual activity: Yes    Partners: Female    Birth control/protection: None  Other Topics Concern  . Not on file  Social History Narrative  . Not on file   Social Determinants of Health   Financial Resource Strain:   . Difficulty of Paying Living Expenses: Not on file  Food Insecurity:   . Worried About Charity fundraiser in the Last Year: Not on file  . Ran Out of Food in the Last Year: Not on file  Transportation Needs:   . Lack of Transportation (Medical): Not on file  . Lack of Transportation (Non-Medical): Not on file  Physical Activity:   . Days of Exercise per Week: Not on file  . Minutes of Exercise per Session: Not on file  Stress:   . Feeling of Stress : Not on file  Social Connections:   . Frequency of  Communication with Friends and Family: Not on file  . Frequency of Social Gatherings with Friends and Family: Not on file  . Attends Religious Services: Not on file  . Active Member of Clubs or Organizations: Not on file  . Attends Archivist Meetings: Not on file  . Marital Status: Not on file  Intimate Partner Violence:   . Fear of Current or Ex-Partner: Not on file  . Emotionally Abused: Not on file  . Physically Abused: Not on file  . Sexually Abused: Not on file    Family History  Problem Relation Age of Onset  . Depression Mother   . Diabetes Mother   . Drug abuse Mother   . Hyperlipidemia Mother   . Hypertension Mother   . Hyperlipidemia Father   . Hypertension Father   . Drug abuse Brother   . Diabetes Maternal Grandmother   . Drug abuse Maternal Grandmother      Immunization History  Administered Date(s) Administered  . DTaP 03/20/1983, 06/01/1983, 10/02/1983, 10/08/1985, 07/19/1988  . Hepatitis B 06/25/2000  . IPV 03/20/1983, 06/01/1983, 10/08/1985, 07/19/1988  . Influenza-Unspecified 11/29/2017  . MMR 07/07/1984, 06/25/2000  . Td 02/14/2000  . Tdap  08/28/2017, 07/11/2019    Outpatient Encounter Medications as of 08/12/2020  Medication Sig  . acetaminophen (TYLENOL) 500 MG tablet Take 2 tablets (1,000 mg total) by mouth every 8 (eight) hours as needed for moderate pain. Take 2 tabs every 8 hours.  . cetirizine (ZYRTEC) 10 MG tablet Take 10 mg by mouth daily.  . clotrimazole-betamethasone (LOTRISONE) cream Apply 1 application topically 2 (two) times daily.  . diclofenac (VOLTAREN) 75 MG EC tablet Take 1 tablet (75 mg total) by mouth 2 (two) times daily.  . VENTOLIN HFA 108 (90 Base) MCG/ACT inhaler SMARTSIG:2 Puff(s) By Mouth 4 Times Daily PRN  . phentermine 37.5 MG capsule Take 1 capsule (37.5 mg total) by mouth every morning.  . [DISCONTINUED] cephALEXin (KEFLEX) 500 MG capsule Take 1 capsule (500 mg total) by mouth 4 (four) times daily. (Patient not  taking: Reported on 08/12/2020)  . [DISCONTINUED] fluticasone (FLONASE) 50 MCG/ACT nasal spray Place 2 sprays into the nose daily.  . [DISCONTINUED] methylPREDNISolone (MEDROL DOSEPAK) 4 MG TBPK tablet 6 day dose pack - take as directed (Patient not taking: Reported on 08/12/2020)  . [DISCONTINUED] phentermine 37.5 MG capsule Take 1 capsule (37.5 mg total) by mouth every morning. (Patient not taking: Reported on 08/12/2020)  . [DISCONTINUED] terbinafine (LAMISIL) 250 MG tablet Take 1 tablet (250 mg total) by mouth daily. (Patient not taking: Reported on 08/12/2020)   No facility-administered encounter medications on file as of 08/12/2020.     ROS: Gen: no fever, chills  Skin: no rash, itching ENT: no ear pain, ear drainage, nasal congestion, rhinorrhea, sinus pressure, sore throat Eyes: no blurry vision, double vision Resp: no cough, wheeze,SOB CV: no CP, palpitations, LE edema,  GI: no heartburn, n/v/d/c, abd pain GU: no dysuria, urgency, frequency, hematuria MSK: no joint pain, myalgias, back pain Neuro: no dizziness, headache, weakness Psych: no depression, anxiety, insomnia   No Known Allergies  BP 124/82   Pulse 77   Temp 98.1 F (36.7 C) (Temporal)   Ht '5\' 11"'  (1.803 m)   Wt 245 lb 12.8 oz (111.5 kg)   SpO2 96%   BMI 34.28 kg/m   Physical Exam Constitutional:      General: He is not in acute distress.    Appearance: He is well-developed.  HENT:     Head: Normocephalic and atraumatic.     Right Ear: Tympanic membrane and ear canal normal.     Left Ear: Tympanic membrane and ear canal normal.     Nose: Nose normal.  Neck:     Thyroid: No thyromegaly.  Cardiovascular:     Rate and Rhythm: Normal rate and regular rhythm.     Heart sounds: Normal heart sounds.  Pulmonary:     Effort: Pulmonary effort is normal.     Breath sounds: Normal breath sounds. No wheezing or rhonchi.  Abdominal:     General: Bowel sounds are normal. There is no distension.     Palpations:  Abdomen is soft. There is no mass.     Tenderness: There is no abdominal tenderness. There is no guarding or rebound.  Musculoskeletal:        General: Normal range of motion.     Cervical back: Neck supple.  Lymphadenopathy:     Cervical: No cervical adenopathy.  Skin:    General: Skin is warm and dry.  Neurological:     Mental Status: He is alert and oriented to person, place, and time.     Motor: No abnormal muscle tone.  Coordination: Coordination normal.      A/P:  1. Adult BMI 34.0-34.9 kg/sq m - Comprehensive metabolic panel - Lipid panel Rx: - phentermine 37.5 MG capsule; Take 1 capsule (37.5 mg total) by mouth every morning.  Dispense: 30 capsule; Refill: 2 - resume overall healthy, well-balanced diet and regular CV exercise - pt understands above Rx is for short-term use (3-36momax)  2. Annual physical exam - discussed importance of regular CV exercise, healthy diet, adequate sleep - UTD on dental exam - declines flu vaccine, covid vaccine; otherwise UTD - Comprehensive metabolic panel - Lipid panel - CBC - next CPE in 1 year  3. Influenza vaccination declined by patient   This visit occurred during the SARS-CoV-2 public health emergency.  Safety protocols were in place, including screening questions prior to the visit, additional usage of staff PPE, and extensive cleaning of exam room while observing appropriate contact time as indicated for disinfecting solutions.

## 2020-08-13 LAB — COMPREHENSIVE METABOLIC PANEL
ALT: 48 IU/L — ABNORMAL HIGH (ref 0–44)
AST: 25 IU/L (ref 0–40)
Albumin/Globulin Ratio: 1.5 (ref 1.2–2.2)
Albumin: 4.3 g/dL (ref 4.0–5.0)
Alkaline Phosphatase: 65 IU/L (ref 44–121)
BUN/Creatinine Ratio: 21 — ABNORMAL HIGH (ref 9–20)
BUN: 20 mg/dL (ref 6–20)
Bilirubin Total: 0.7 mg/dL (ref 0.0–1.2)
CO2: 28 mmol/L (ref 20–29)
Calcium: 9.7 mg/dL (ref 8.7–10.2)
Chloride: 104 mmol/L (ref 96–106)
Creatinine, Ser: 0.96 mg/dL (ref 0.76–1.27)
GFR calc Af Amer: 116 mL/min/{1.73_m2} (ref >59)
GFR calc non Af Amer: 101 mL/min/{1.73_m2} (ref >59)
Globulin, Total: 2.8 g/dL (ref 1.5–4.5)
Glucose: 91 mg/dL (ref 65–99)
Potassium: 4.3 mmol/L (ref 3.5–5.2)
Sodium: 144 mmol/L (ref 134–144)
Total Protein: 7.1 g/dL (ref 6.0–8.5)

## 2020-08-13 LAB — CBC
Hematocrit: 42.2 % (ref 37.5–51.0)
Hemoglobin: 14.5 g/dL (ref 13.0–17.7)
MCH: 28.8 pg (ref 26.6–33.0)
MCHC: 34.4 g/dL (ref 31.5–35.7)
MCV: 84 fL (ref 79–97)
Platelets: 183 10*3/uL (ref 150–450)
RBC: 5.04 x10E6/uL (ref 4.14–5.80)
RDW: 13.5 % (ref 11.6–15.4)
WBC: 5.9 10*3/uL (ref 3.4–10.8)

## 2020-08-13 LAB — LIPID PANEL
Chol/HDL Ratio: 6.8 ratio — ABNORMAL HIGH (ref 0.0–5.0)
Cholesterol, Total: 216 mg/dL — ABNORMAL HIGH (ref 100–199)
HDL: 32 mg/dL — ABNORMAL LOW (ref >39)
LDL Chol Calc (NIH): 112 mg/dL — ABNORMAL HIGH (ref 0–99)
Triglycerides: 418 mg/dL — ABNORMAL HIGH (ref 0–149)
VLDL Cholesterol Cal: 72 mg/dL — ABNORMAL HIGH (ref 5–40)

## 2020-08-17 ENCOUNTER — Encounter: Payer: Self-pay | Admitting: Family Medicine

## 2021-07-04 ENCOUNTER — Other Ambulatory Visit: Payer: Self-pay

## 2021-07-04 ENCOUNTER — Encounter: Payer: Self-pay | Admitting: Family Medicine

## 2021-07-04 ENCOUNTER — Ambulatory Visit (INDEPENDENT_AMBULATORY_CARE_PROVIDER_SITE_OTHER): Payer: No Typology Code available for payment source | Admitting: Family Medicine

## 2021-07-04 VITALS — BP 126/84 | HR 101 | Temp 97.1°F | Ht 71.0 in | Wt 260.2 lb

## 2021-07-04 DIAGNOSIS — E6609 Other obesity due to excess calories: Secondary | ICD-10-CM

## 2021-07-04 DIAGNOSIS — Z6836 Body mass index (BMI) 36.0-36.9, adult: Secondary | ICD-10-CM | POA: Diagnosis not present

## 2021-07-04 DIAGNOSIS — E782 Mixed hyperlipidemia: Secondary | ICD-10-CM

## 2021-07-04 MED ORDER — PHENTERMINE HCL 37.5 MG PO CAPS: 37.5000 mg | ORAL_CAPSULE | ORAL | 2 refills | Status: DC

## 2021-07-04 NOTE — Progress Notes (Signed)
Established Patient Office Visit  Subjective:  Patient ID: Jerry Schmidt, male    DOB: [birth year]  Age: 40 y.o. MRN: [id number]  CC:  Chief Complaint  Patient presents with   Transitions Of Care    TOC from Dr. Salena Saner patient would like to start back on phentermine.     HPI Jerry Schmidt presents for follow-up of his obesity.  He is seeking a refill on the phentermine.  He had been told that there would be 3 months given without refills.  He gained 15 pounds after stopping the medication.  He works third shift.  He consumes a lot of fast food meals.  He rarely drinks alcohol.  He has not been working out.  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    Family History  Problem Relation Age of Onset   Depression Mother    Diabetes Mother    Drug abuse Mother    Hyperlipidemia Mother    Hypertension Mother    Hyperlipidemia Father    Hypertension Father    Drug abuse Brother    Diabetes Maternal Grandmother    Drug abuse Maternal Grandmother     Social History   Socioeconomic History   Marital status: Married    Spouse name: Not on file   Number of children: Not on file   Years of education: Not on file   Highest education level: Not on file  Occupational History   Not on file  Tobacco Use   Smoking status: Never   Smokeless tobacco: Never  Vaping Use   Vaping Use: Never used  Substance and Sexual Activity   Alcohol use: Yes    Comment: occ   Drug use: No   Sexual activity: Yes    Partners: Female    Birth control/protection: None  Other Topics Concern   Not on file  Social History Narrative   Not on file   Social Determinants of Health   Financial Resource Strain: Not on file  Food Insecurity: Not on file  Transportation Needs: Not on file  Physical Activity: Not on file  Stress:  Not on file  Social Connections: Not on file  Intimate Partner Violence: Not on file    Outpatient Medications Prior to Visit  Medication Sig Dispense Refill   acetaminophen (TYLENOL) 500 MG tablet Take 2 tablets (1,000 mg total) by mouth every 8 (eight) hours as needed for moderate pain. Take 2 tabs every 8 hours. 30 tablet 0   cetirizine (ZYRTEC) 10 MG tablet Take 10 mg by mouth daily.     VENTOLIN HFA 108 (90 Base) MCG/ACT inhaler SMARTSIG:2 Puff(s) By Mouth 4 Times Daily PRN     clotrimazole-betamethasone (LOTRISONE) cream Apply 1 application topically 2 (two) times daily. 45 g 3   diclofenac (VOLTAREN) 75 MG EC tablet Take 1 tablet (75 mg total) by mouth 2 (two) times daily. 60 tablet 1   phentermine 37.5 MG capsule Take 1 capsule (37.5 mg total) by mouth every morning. (Patient not taking: Reported on 07/04/2021) 30 capsule 2   No facility-administered medications prior to visit.    No Known Allergies  ROS Review of Systems  Constitutional: Negative.   Respiratory: Negative.    Cardiovascular: Negative.   Gastrointestinal: Negative.   Psychiatric/Behavioral: Negative.  Objective:    Physical Exam Vitals and nursing note reviewed.  Constitutional:      General: He is not in acute distress.    Appearance: Normal appearance. He is obese. He is not ill-appearing, toxic-appearing or diaphoretic.  HENT:     Head: Normocephalic and atraumatic.     Mouth/Throat:     Mouth: Mucous membranes are moist.     Pharynx: Oropharynx is clear. No oropharyngeal exudate or posterior oropharyngeal erythema.  Eyes:     General: No scleral icterus.       Right eye: No discharge.        Left eye: No discharge.     Extraocular Movements: Extraocular movements intact.     Conjunctiva/sclera: Conjunctivae normal.     Pupils: Pupils are equal, round, and reactive to light.  Cardiovascular:     Pulses: Normal pulses.     Heart sounds: Normal heart sounds.  Pulmonary:     Effort:  Pulmonary effort is normal.     Breath sounds: Normal breath sounds.  Musculoskeletal:     Cervical back: No rigidity or tenderness.  Lymphadenopathy:     Cervical: No cervical adenopathy.  Skin:    General: Skin is warm.  Neurological:     Mental Status: He is alert and oriented to person, place, and time.  Psychiatric:        Mood and Affect: Mood normal.        Behavior: Behavior normal.    BP 126/84 (BP Location: Left Arm, Patient Position: Sitting, Cuff Size: Large)   Pulse (!) 101   Temp (!) 97.1 F (36.2 C) (Temporal)   Ht 5\' 11"  (1.803 m)   Wt 260 lb 3.2 oz (118 kg)   SpO2 97%   BMI 36.29 kg/m  Wt Readings from Last 3 Encounters:  07/04/21 260 lb 3.2 oz (118 kg)  08/12/20 245 lb 12.8 oz (111.5 kg)  07/11/19 235 lb (106.6 kg)     Health Maintenance Due  Topic Date Due   HIV Screening  Never done   Hepatitis C Screening  Never done   INFLUENZA VACCINE  06/26/2021    There are no preventive care reminders to display for this patient.  Lab Results  Component Value Date   TSH 1.76 08/28/2017   Lab Results  Component Value Date   WBC 5.9 08/12/2020   HGB 14.5 08/12/2020   HCT 42.2 08/12/2020   MCV 84 08/12/2020   PLT 183 08/12/2020   Lab Results  Component Value Date   NA 144 08/12/2020   K 4.3 08/12/2020   CO2 28 08/12/2020   GLUCOSE 91 08/12/2020   BUN 20 08/12/2020   CREATININE 0.96 08/12/2020   BILITOT 0.7 08/12/2020   ALKPHOS 65 08/12/2020   AST 25 08/12/2020   ALT 48 (H) 08/12/2020   PROT 7.1 08/12/2020   ALBUMIN 4.3 08/12/2020   CALCIUM 9.7 08/12/2020   ANIONGAP 9 09/11/2017   GFR 82.29 11/11/2018   Lab Results  Component Value Date   CHOL 216 (H) 08/12/2020   Lab Results  Component Value Date   HDL 32 (L) 08/12/2020   Lab Results  Component Value Date   LDLCALC 112 (H) 08/12/2020   Lab Results  Component Value Date   TRIG 418 (H) 08/12/2020   Lab Results  Component Value Date   CHOLHDL 6.8 (H) 08/12/2020   Lab Results   Component Value Date   HGBA1C 5.5 08/28/2017      Assessment &  Plan:   Problem List Items Addressed This Visit   None Visit Diagnoses     Class 2 obesity due to excess calories with body mass index (BMI) of 36.0 to 36.9 in adult, unspecified whether serious comorbidity present    -  Primary   Relevant Medications   phentermine 37.5 MG capsule   Other Relevant Orders   Amb ref to Medical Nutrition Therapy-MNT   Elevated triglycerides with high cholesterol           Meds ordered this encounter  Medications   phentermine 37.5 MG capsule    Sig: Take 1 capsule (37.5 mg total) by mouth every morning.    Dispense:  30 capsule    Refill:  2     Follow-up: Return in about 3 months (around 10/04/2021).  Given information on obesity and steps to prevent it.  Given information on bariatric surgery.  Referred to nutritionist.  Again, he knows there will be no refill on the phentermine.  He will be due for a physical in October we will recheck his elevated triglycerides and elevated liver enzymes..  We had a long discussion for at least 30 minutes about obesity.  Discussed that stimulants have not been proven to be effective over time.  Bariatric surgery is probably the best way to go but it is obviously invasive.  Agrees to go see the nutritionist.  Mliss Sax, MD

## 2021-07-10 IMAGING — CR LEFT LITTLE FINGER 2+V
3 series · 3 of 3 positions shown · non-contrast
Comparison: None.

CLINICAL DATA: Left little finger injury and laceration on saw.
Initial encounter.

EXAM:
LEFT LITTLE FINGER 2+V

[x finger pa left]
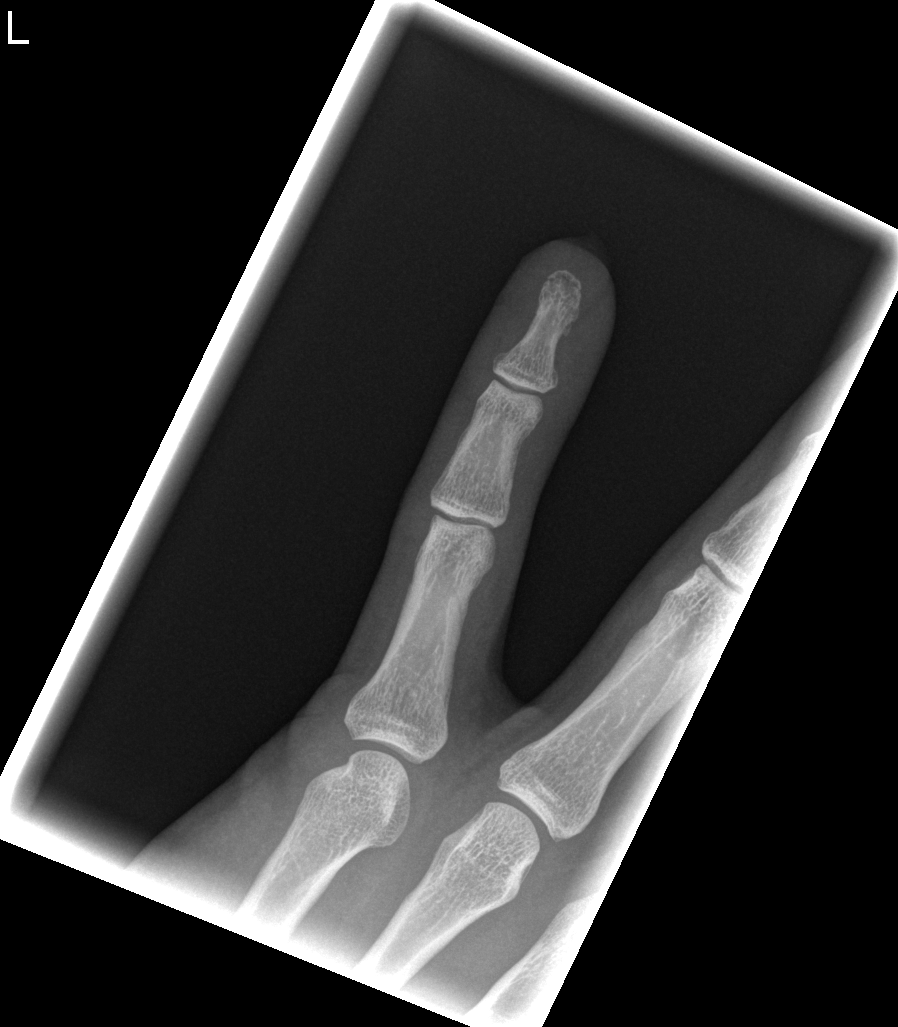

[x finger obl. left]
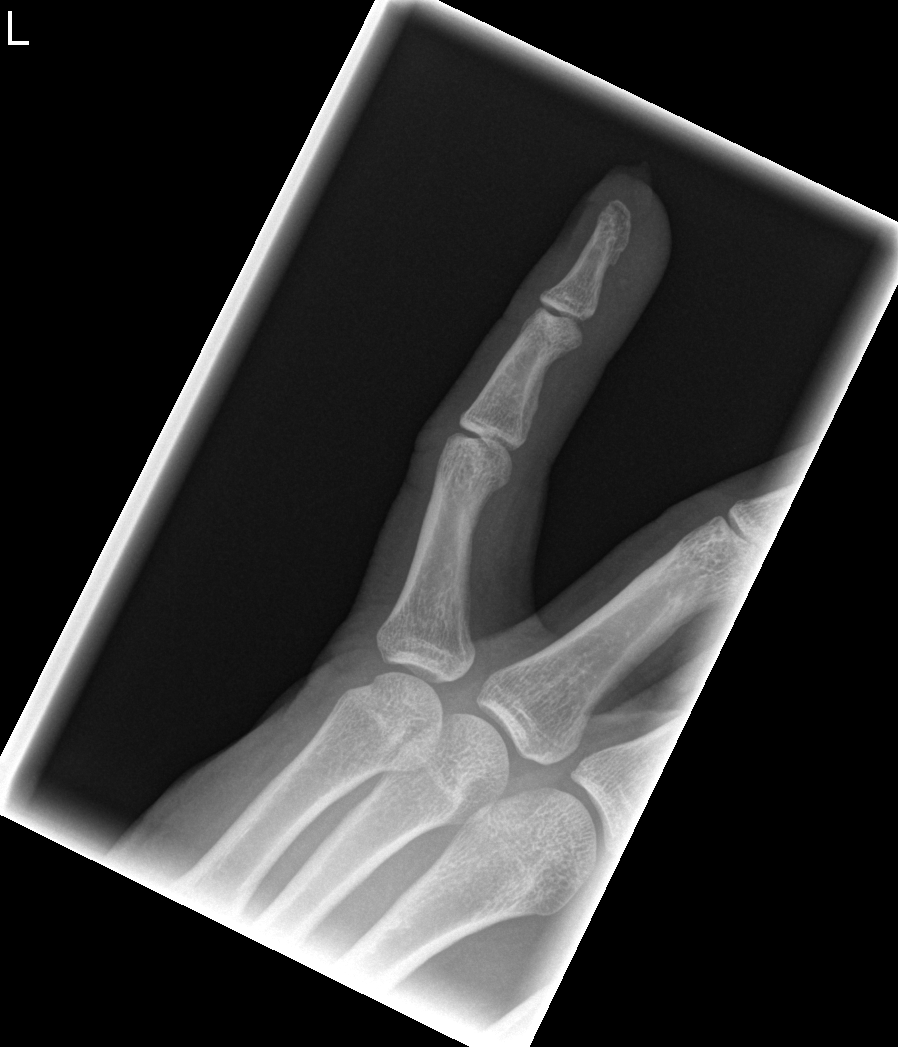

[x finger lateral left]
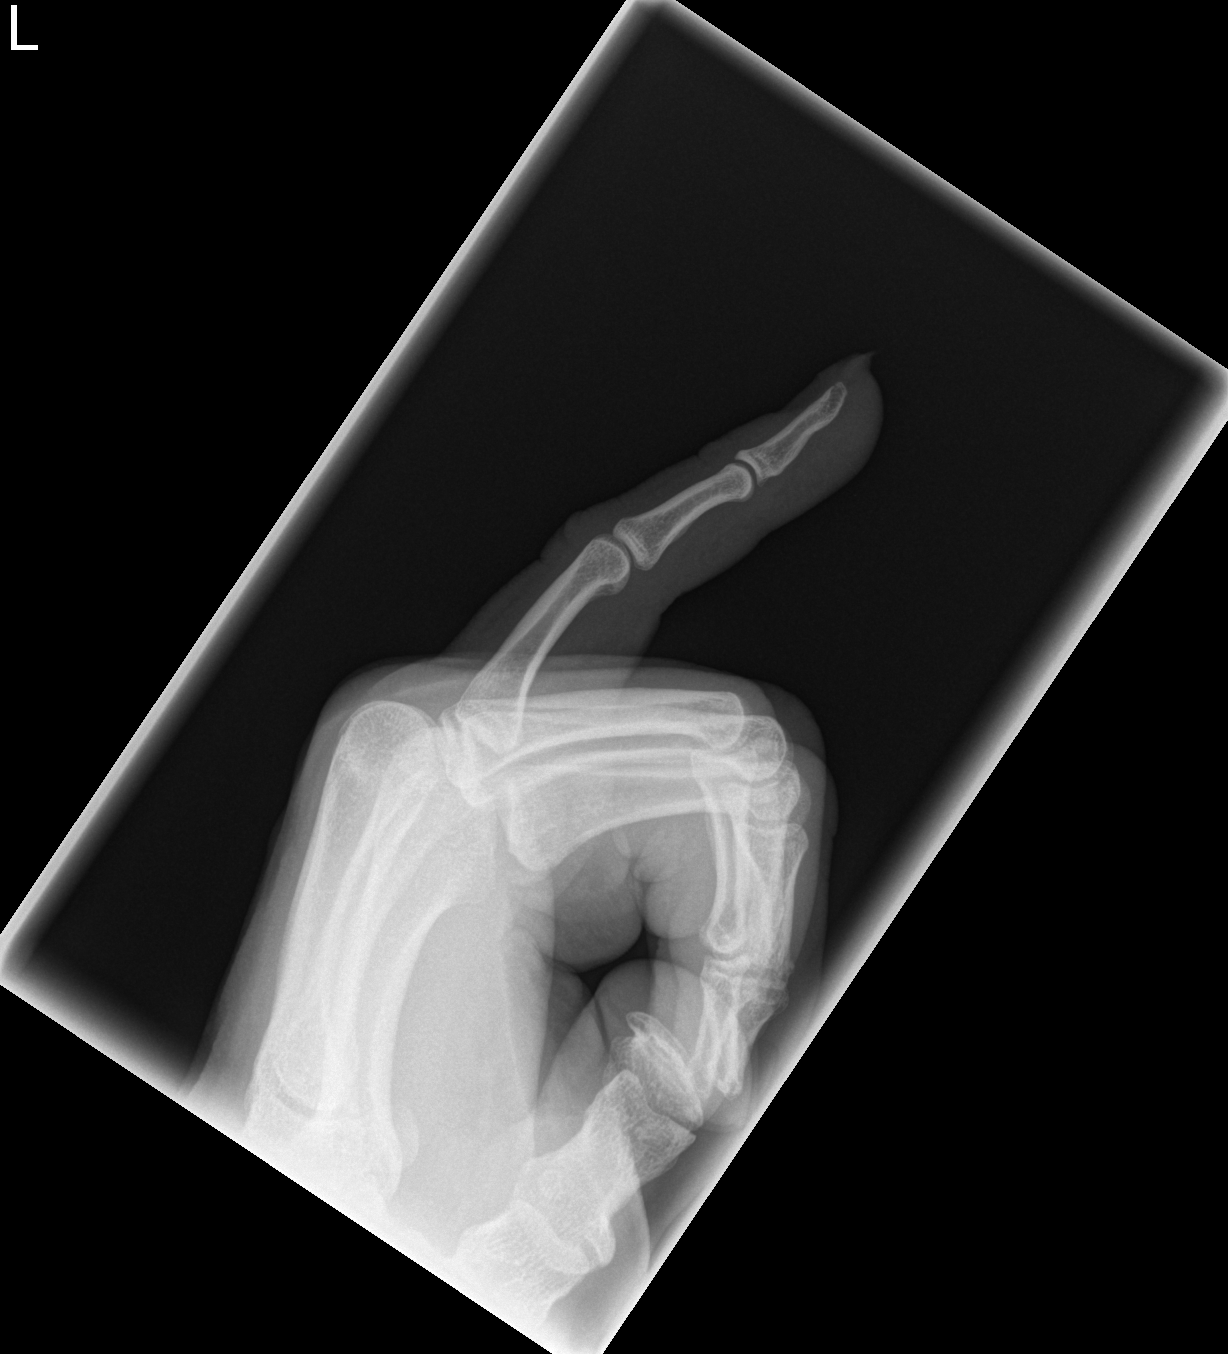

[3 of 3 positions shown; findings below may reference images not displayed]

FINDINGS: Nondisplaced fracture is seen through the terminal tuft of the
distal phalanx. No other fractures identified. No evidence of
dislocation. No radiopaque foreign body identified.
IMPRESSION: Nondisplaced fracture through the terminal tuft of the distal
phalanx.

## 2021-12-06 ENCOUNTER — Telehealth: Payer: Self-pay

## 2021-12-06 NOTE — Telephone Encounter (Signed)
Patient called in and wanted to transfer to : Dr. Cherlynn Kaiser from Dr. Ethelene Hal patient wife is seen by Dr. Cherlynn Kaiser and wants to have the same provider.

## 2021-12-07 NOTE — Telephone Encounter (Signed)
Patient scheduled.

## 2021-12-19 DIAGNOSIS — Z0289 Encounter for other administrative examinations: Secondary | ICD-10-CM

## 2021-12-20 ENCOUNTER — Other Ambulatory Visit: Payer: Self-pay

## 2021-12-20 ENCOUNTER — Encounter (INDEPENDENT_AMBULATORY_CARE_PROVIDER_SITE_OTHER): Payer: Self-pay | Admitting: Family Medicine

## 2021-12-20 ENCOUNTER — Ambulatory Visit (INDEPENDENT_AMBULATORY_CARE_PROVIDER_SITE_OTHER): Payer: No Typology Code available for payment source | Admitting: Family Medicine

## 2021-12-20 VITALS — BP 123/77 | HR 87 | Temp 98.2°F | Ht 70.0 in | Wt 251.0 lb

## 2021-12-20 DIAGNOSIS — Z1331 Encounter for screening for depression: Secondary | ICD-10-CM | POA: Diagnosis not present

## 2021-12-20 DIAGNOSIS — R0602 Shortness of breath: Secondary | ICD-10-CM

## 2021-12-20 DIAGNOSIS — E669 Obesity, unspecified: Secondary | ICD-10-CM | POA: Diagnosis not present

## 2021-12-20 DIAGNOSIS — Z9189 Other specified personal risk factors, not elsewhere classified: Secondary | ICD-10-CM

## 2021-12-20 DIAGNOSIS — E7849 Other hyperlipidemia: Secondary | ICD-10-CM

## 2021-12-20 DIAGNOSIS — R7303 Prediabetes: Secondary | ICD-10-CM | POA: Diagnosis not present

## 2021-12-20 DIAGNOSIS — K760 Fatty (change of) liver, not elsewhere classified: Secondary | ICD-10-CM | POA: Diagnosis not present

## 2021-12-20 DIAGNOSIS — R5383 Other fatigue: Secondary | ICD-10-CM | POA: Diagnosis not present

## 2021-12-20 DIAGNOSIS — Z6836 Body mass index (BMI) 36.0-36.9, adult: Secondary | ICD-10-CM

## 2021-12-21 LAB — COMPREHENSIVE METABOLIC PANEL
ALT: 34 IU/L (ref 0–44)
AST: 27 IU/L (ref 0–40)
Albumin/Globulin Ratio: 1.8 (ref 1.2–2.2)
Albumin: 4.9 g/dL (ref 4.0–5.0)
Alkaline Phosphatase: 70 IU/L (ref 44–121)
BUN/Creatinine Ratio: 16 (ref 9–20)
BUN: 16 mg/dL (ref 6–20)
Bilirubin Total: 0.8 mg/dL (ref 0.0–1.2)
CO2: 26 mmol/L (ref 20–29)
Calcium: 9.5 mg/dL (ref 8.7–10.2)
Chloride: 101 mmol/L (ref 96–106)
Creatinine, Ser: 1.01 mg/dL (ref 0.76–1.27)
Globulin, Total: 2.7 g/dL (ref 1.5–4.5)
Glucose: 92 mg/dL (ref 70–99)
Potassium: 4 mmol/L (ref 3.5–5.2)
Sodium: 140 mmol/L (ref 134–144)
Total Protein: 7.6 g/dL (ref 6.0–8.5)
eGFR: 98 mL/min/{1.73_m2} (ref >59)

## 2021-12-21 LAB — CBC WITH DIFFERENTIAL/PLATELET
Basophils Absolute: 0 10*3/uL (ref 0.0–0.2)
Basos: 1 %
EOS (ABSOLUTE): 0.3 10*3/uL (ref 0.0–0.4)
Eos: 4 %
Hematocrit: 43 % (ref 37.5–51.0)
Hemoglobin: 14.6 g/dL (ref 13.0–17.7)
Immature Grans (Abs): 0 10*3/uL (ref 0.0–0.1)
Immature Granulocytes: 1 %
Lymphocytes Absolute: 1.9 10*3/uL (ref 0.7–3.1)
Lymphs: 31 %
MCH: 27.7 pg (ref 26.6–33.0)
MCHC: 34 g/dL (ref 31.5–35.7)
MCV: 82 fL (ref 79–97)
Monocytes Absolute: 0.5 10*3/uL (ref 0.1–0.9)
Monocytes: 8 %
Neutrophils Absolute: 3.5 10*3/uL (ref 1.4–7.0)
Neutrophils: 55 %
Platelets: 171 10*3/uL (ref 150–450)
RBC: 5.27 x10E6/uL (ref 4.14–5.80)
RDW: 13.7 % (ref 11.6–15.4)
WBC: 6.2 10*3/uL (ref 3.4–10.8)

## 2021-12-21 LAB — T4, FREE: Free T4: 1.16 ng/dL (ref 0.82–1.77)

## 2021-12-21 LAB — LIPID PANEL WITH LDL/HDL RATIO
Cholesterol, Total: 224 mg/dL — ABNORMAL HIGH (ref 100–199)
HDL: 39 mg/dL — ABNORMAL LOW (ref >39)
LDL Chol Calc (NIH): 134 mg/dL — ABNORMAL HIGH (ref 0–99)
LDL/HDL Ratio: 3.4 ratio (ref 0.0–3.6)
Triglycerides: 284 mg/dL — ABNORMAL HIGH (ref 0–149)
VLDL Cholesterol Cal: 51 mg/dL — ABNORMAL HIGH (ref 5–40)

## 2021-12-21 LAB — VITAMIN B12: Vitamin B-12: 338 pg/mL (ref 232–1245)

## 2021-12-21 LAB — VITAMIN D 25 HYDROXY (VIT D DEFICIENCY, FRACTURES): Vit D, 25-Hydroxy: 26 ng/mL — ABNORMAL LOW (ref 30.0–100.0)

## 2021-12-21 LAB — HEMOGLOBIN A1C
Est. average glucose Bld gHb Est-mCnc: 117 mg/dL
Hgb A1c MFr Bld: 5.7 % — ABNORMAL HIGH (ref 4.8–5.6)

## 2021-12-21 LAB — T3: T3, Total: 160 ng/dL (ref 71–180)

## 2021-12-21 LAB — FOLATE: Folate: 11.3 ng/mL (ref >3.0)

## 2021-12-21 LAB — TSH: TSH: 1.77 u[IU]/mL (ref 0.450–4.500)

## 2021-12-21 LAB — INSULIN, RANDOM: INSULIN: 15.7 u[IU]/mL (ref 2.6–24.9)

## 2021-12-21 NOTE — Progress Notes (Signed)
Chief Complaint:   OBESITY Jerry Schmidt (MR# [id number]) is a 40 y.o. male who presents for evaluation and treatment of obesity and related comorbidities. Current BMI is Body mass index is 36.01 kg/m. Jerry Schmidt has been struggling with his weight for many years and has been unsuccessful in either losing weight, maintaining weight loss, or reaching his healthy weight goal.  Jerry Schmidt is currently in the action stage of change and ready to dedicate time achieving and maintaining a healthier weight. [name] is interested in becoming our patient and working on intensive lifestyle modifications including (but not limited to) diet and exercise for weight loss.  Pt's wife is a pt. Pt is on phentermine 37.5 mg (on for ~3-6 months). He works 1:30-1:30 and works 6 hours on Sat and Sun. He skips breakfast and/or lunch daily. Bagel, steak, egg, and cheese with sweet tea (satisfied); Skips actual lunch; Snack- chips, nuts, then naps for ~2 hours; Eat dinner ~8:30-9 PM- chicken wings or ribeye steak, salad, and potatoes.  [name]'s habits were reviewed today and are as follows: His family eats meals together, he thinks his family will eat healthier with him, his desired weight loss is 51 lbs, he has been heavy most of his life, he started gaining weight when he started a family, his heaviest weight ever was 250 pounds, he skips meals frequently, he is frequently drinking liquids with calories, he has problems with excessive hunger, he frequently eats larger portions than normal, he has binge eating behaviors, and he struggles with emotional eating.  Depression Screen [name] Food and Mood (modified PHQ-9) score was 10.  Depression screen [name] 2/9 12/20/2021  Decreased Interest 3  Down, Depressed, Hopeless 0  PHQ - 2 Score 3  Altered sleeping 1  Tired, decreased energy 3  Change in appetite 2  Feeling bad or failure about yourself  1  Trouble concentrating 0  Moving slowly  or fidgety/restless 0  Suicidal thoughts 0  PHQ-9 Score 10  Difficult doing work/chores Not difficult at all   Subjective:   1. Other fatigue [name] admits to daytime somnolence and admits to waking up still tired. Patent has a history of symptoms of daytime fatigue and morning fatigue. [name] generally gets 3 hours of sleep per night, and states that he has poor sleep quality. Snoring is present. Apneic episodes are not present. Epworth Sleepiness Score is 14. EKG normal sinus rhythm at 89 bpm with T-wave inversion in III and aVF.  2. SOBOE (shortness of breath on exertion) Jerry Schmidt notes increasing shortness of breath with exercising and seems to be worsening over time with weight gain. He notes getting out of breath sooner with activity than he used to. This has gotten worse recently. Jerry Schmidt denies shortness of breath at rest or orthopnea.  3. Prediabetes Recent diagnosis. Pt is not on meds.  4. NAFLD (nonalcoholic fatty liver disease) Last ALT was minimally elevated and AST was normal. CT scan from 2018 showing steatosis.  5. Other hyperlipidemia Pt last LDL was 112, HDL 32, and triglycerides 973. He is not on meds.  6. At risk for deficient intake of food Pt is at risk for deficient intake of food due to inadequate/poor intake.  Assessment/Plan:   1. Other fatigue [name] does feel that his weight is causing his energy to be lower than it should be. Fatigue may be related to obesity, depression or many other causes. Labs will be ordered, and in the meanwhile, [name] will focus on self  care including making healthy food choices, increasing physical activity and focusing on stress reduction. Check labs today.  - EKG 12-Lead - Vitamin B12 - Folate - T3 - T4, free - TSH - VITAMIN D 25 Hydroxy (Vit-D Deficiency, Fractures)  2. SOBOE (shortness of breath on exertion) Jerry Schmidt does feel that he gets out of breath more easily that he used to when he  exercises. Jerry Schmidt's shortness of breath appears to be obesity related and exercise induced. He has agreed to work on weight loss and gradually increase exercise to treat his exercise induced shortness of breath. Will continue to monitor closely. Check labs today.  - CBC with Differential/Platelet - Lipid Panel With LDL/HDL Ratio  3. Prediabetes Jerry Schmidt will continue to work on weight loss, exercise, and decreasing simple carbohydrates to help decrease the risk of diabetes. Check labs today.  - Comprehensive metabolic panel - Hemoglobin A1c - Insulin, random  4. NAFLD (nonalcoholic fatty liver disease) We discussed the likely diagnosis of non-alcoholic fatty liver disease today and how this condition is obesity related. Jerry Schmidt was educated the importance of weight loss. Jerry Schmidt agreed to continue with his weight loss efforts with healthier diet and exercise as an essential part of his treatment plan. Check CMP today.  5. Other hyperlipidemia Cardiovascular risk and specific lipid/LDL goals reviewed.  We discussed several lifestyle modifications today and Jerry Schmidt will continue to work on diet, exercise and weight loss efforts. Orders and follow up as documented in patient record. Check FLP today.  Counseling Intensive lifestyle modifications are the first line treatment for this issue. Dietary changes: Increase soluble fiber. Decrease simple carbohydrates. Exercise changes: Moderate to vigorous-intensity aerobic activity 150 minutes per week if tolerated. Lipid-lowering medications: see documented in medical record.  6. Depression screening Jerry Schmidt had a positive depression screening. Depression is commonly associated with obesity and often results in emotional eating behaviors. We will monitor this closely and work on CBT to help improve the non-hunger eating patterns. Referral to Psychology may be required if no improvement is seen as he continues in our  clinic.  7. At risk for deficient intake of food Jerry Schmidt was given approximately 15 minutes of deficit intake of food prevention counseling today. Jerry Schmidt is at risk for eating too few calories based on current food recall. He was encouraged to focus on meeting caloric and protein goals according to his recommended meal plan.    8. Obesity with current BMI of 36.0 Jerry Schmidt is currently in the action stage of change and his goal is to continue with weight loss efforts. I recommend Jerry Schmidt begin the structured treatment plan as follows:  He has agreed to the Category 4 Plan + 300 calories.  Discontinue Phentermine.  Exercise goals: No exercise has been prescribed at this time.   Behavioral modification strategies: increasing lean protein intake, decreasing eating out, meal planning and cooking strategies, and keeping healthy foods in the home.  He was informed of the importance of frequent follow-up visits to maximize his success with intensive lifestyle modifications for his multiple health conditions. He was informed we would discuss his lab results at his next visit unless there is a critical issue that needs to be addressed sooner. Jerry Schmidt agreed to keep his next visit at the agreed upon time to discuss these results.  Objective:   Blood pressure 123/77, pulse 87, temperature 98.2 F (36.8 C), height 5\' 10"  (1.778 m), weight 251 lb (113.9 kg), SpO2 97 %. Body mass index is 36.01 kg/m.  EKG: Normal sinus  rhythm, rate 89- T-wave inversion in III and aVF.  Indirect Calorimeter completed today shows a VO2 of 365 and a REE of 2520.  His calculated basal metabolic rate is [id number] thus his basal metabolic rate is better than expected.  General: Cooperative, alert, well developed, in no acute distress. HEENT: Conjunctivae and lids unremarkable. Cardiovascular: Regular rhythm.  Lungs: Normal work of breathing. Neurologic: No focal deficits.   Lab Results  Component Value  Date   CREATININE 1.01 12/20/2021   BUN 16 12/20/2021   NA 140 12/20/2021   K 4.0 12/20/2021   CL 101 12/20/2021   CO2 26 12/20/2021   Lab Results  Component Value Date   ALT 34 12/20/2021   AST 27 12/20/2021   ALKPHOS 70 12/20/2021   BILITOT 0.8 12/20/2021   Lab Results  Component Value Date   HGBA1C 5.7 (H) 12/20/2021   HGBA1C 5.5 08/28/2017   Lab Results  Component Value Date   INSULIN 15.7 12/20/2021   Lab Results  Component Value Date   TSH 1.770 12/20/2021   Lab Results  Component Value Date   CHOL 224 (H) 12/20/2021   HDL 39 (L) 12/20/2021   LDLCALC 134 (H) 12/20/2021   LDLDIRECT 141.0 08/28/2017   TRIG 284 (H) 12/20/2021   CHOLHDL 6.8 (H) 08/12/2020   Lab Results  Component Value Date   WBC 6.2 12/20/2021   HGB 14.6 12/20/2021   HCT 43.0 12/20/2021   MCV 82 12/20/2021   PLT 171 12/20/2021    Attestation Statements:   Reviewed by clinician on day of visit: allergies, medications, problem list, medical history, surgical history, family history, social history, and previous encounter notes.  Edmund HildaI, Tamesha Frazier, CMA, am acting as transcriptionist for Reuben LikesAlexandria Delyle Weider, MD.  This is the patient's first visit at [facility]. The patient's NEW PATIENT PACKET was reviewed at length. Included in the packet: current and past health history, medications, allergies, ROS, gynecologic history (women only), surgical history, family history, social history, weight history, weight loss surgery history (for those that have had weight loss surgery), nutritional evaluation, mood and food questionnaire, PHQ9, Epworth questionnaire, sleep habits questionnaire, patient life and health improvement goals questionnaire. These will all be scanned into the patient's chart under media.   During the visit, I independently reviewed the patient's EKG, bioimpedance scale results, and indirect calorimeter results. I used this information to tailor a meal plan for the  patient that will help him to lose weight and will improve his obesity-related conditions going forward. I performed a medically necessary appropriate examination and/or evaluation. I discussed the assessment and treatment plan with the patient. The patient was provided an opportunity to ask questions and all were answered. The patient agreed with the plan and demonstrated an understanding of the instructions. Labs were ordered at this visit and will be reviewed at the next visit unless more critical results need to be addressed immediately. Clinical information was updated and documented in the EMR.   Time spent on visit including pre-visit chart review and post-visit care was 45 minutes.   A separate 15 minutes was spent on risk counseling (see above).   I have reviewed the above documentation for accuracy and completeness, and I agree with the above. - Reuben LikesAlexandria Tesia Lybrand, MD

## 2021-12-25 ENCOUNTER — Encounter (INDEPENDENT_AMBULATORY_CARE_PROVIDER_SITE_OTHER): Payer: Self-pay | Admitting: Family Medicine

## 2021-12-26 NOTE — Telephone Encounter (Signed)
Please advise 

## 2021-12-26 NOTE — Telephone Encounter (Signed)
Dr.Ukleja 

## 2021-12-27 NOTE — Telephone Encounter (Signed)
Please review

## 2022-01-04 ENCOUNTER — Encounter (INDEPENDENT_AMBULATORY_CARE_PROVIDER_SITE_OTHER): Payer: Self-pay | Admitting: Family Medicine

## 2022-01-04 ENCOUNTER — Ambulatory Visit (INDEPENDENT_AMBULATORY_CARE_PROVIDER_SITE_OTHER): Payer: No Typology Code available for payment source | Admitting: Family Medicine

## 2022-01-04 ENCOUNTER — Other Ambulatory Visit: Payer: Self-pay

## 2022-01-04 VITALS — BP 115/75 | HR 86 | Temp 98.1°F | Ht 70.0 in | Wt 249.0 lb

## 2022-01-04 DIAGNOSIS — Z9189 Other specified personal risk factors, not elsewhere classified: Secondary | ICD-10-CM

## 2022-01-04 DIAGNOSIS — E7849 Other hyperlipidemia: Secondary | ICD-10-CM | POA: Diagnosis not present

## 2022-01-04 DIAGNOSIS — E559 Vitamin D deficiency, unspecified: Secondary | ICD-10-CM

## 2022-01-04 DIAGNOSIS — E669 Obesity, unspecified: Secondary | ICD-10-CM

## 2022-01-04 DIAGNOSIS — Z6835 Body mass index (BMI) 35.0-35.9, adult: Secondary | ICD-10-CM

## 2022-01-04 DIAGNOSIS — R7303 Prediabetes: Secondary | ICD-10-CM

## 2022-01-08 MED ORDER — VITAMIN D (ERGOCALCIFEROL) 1.25 MG (50000 UNIT) PO CAPS: 50000.0000 [IU] | ORAL_CAPSULE | ORAL | 0 refills | Status: DC

## 2022-01-08 NOTE — Progress Notes (Signed)
Chief Complaint:   OBESITY Jerry Schmidt is here to discuss his progress with his obesity treatment plan along with follow-up of his obesity related diagnoses. Jerry Schmidt is on the Category 4 Plan and states he is following his eating plan approximately 90% of the time. Jerry Schmidt states he is not currently exercising.  Today's visit was #: 2 Starting weight: 251 lbs Starting date: 12/20/2021 Today's weight: 249 lbs Today's date: 01/04/2022 Total lbs lost to date: 2 Total lbs lost since last in-office visit: 2  Interim History: Pt did experience hunger initially over the last few weeks. His scale at home is showing a loss of 8 lbs. He feels some tiredness during the day. Pt wants to start exercising. He is celebrating his birthday this weekend. Pt is wondering about adding more meat at breakfast.  Subjective:   1. Other hyperlipidemia Discussed labs with patient today. Jerry Schmidt has an LDL of 134, HDL 39, and triglycerides 846. He is not on statin. Can't risk stratify as not included in ASCVD.  2. Vitamin D deficiency New. Discussed labs with patient today. Jerry Schmidt denies nausea, vomiting, and muscle weakness but notes fatigue. He is not on prescription Vit D.  3. Prediabetes New. Discussed labs with patient today. Jerry Schmidt A1c is 5.7 with an insulin level of 15.7.  4. At risk of diabetes mellitus Jerry Schmidt is at higher than average risk for developing diabetes due to his obesity.  Assessment/Plan:   1. Other hyperlipidemia Cardiovascular risk and specific lipid/LDL goals reviewed.  We discussed several lifestyle modifications today and Jerry Schmidt will continue to work on diet, exercise and weight loss efforts. Orders and follow up as documented in patient record. F/u labs in 3-4 months.  Counseling Intensive lifestyle modifications are the first line treatment for this issue. Dietary changes: Increase soluble fiber. Decrease simple carbohydrates. Exercise  changes: Moderate to vigorous-intensity aerobic activity 150 minutes per week if tolerated. Lipid-lowering medications: see documented in medical record.  2. Vitamin D deficiency Low Vitamin D level contributes to fatigue and are associated with obesity, breast, and colon cancer. He agrees to start to take prescription Vitamin D 50,000 IU every week and will follow-up for routine testing of Vitamin D, at least 2-3 times per year to avoid over-replacement.  Start- Vitamin D, Ergocalciferol, (DRISDOL) 1.25 MG (50000 UNIT) CAPS capsule; Take 1 capsule (50,000 Units total) by mouth every 7 (seven) days.  Dispense: 4 capsule; Refill: 0  3. Prediabetes Jerry Schmidt will continue to work on weight loss, exercise, and decreasing simple carbohydrates to help decrease the risk of diabetes. Continue meal plan with no changes. Repeat labs in 3 months.  4. At risk of diabetes mellitus Jerry Schmidt was given approximately 30 minutes of diabetic education and counseling today. We discussed intensive lifestyle modifications today with an emphasis on weight loss as well as increasing exercise and decreasing simple carbohydrates in his diet. We also reviewed medication options with an emphasis on risk versus benefits of those discussed.  Repetitive spaced learning was employed today to elicit superior memory formation and behavioral change.  5. Obesity with current BMI of 35.8 Jerry Schmidt is currently in the action stage of change. As such, his goal is to continue with weight loss efforts. He has agreed to keeping a food journal and adhering to recommended goals of 2000-2200 calories and 145+ grams protein.   Exercise goals: All adults should avoid inactivity. Some physical activity is better than none, and adults who participate in any amount of physical activity gain some  health benefits.  Behavioral modification strategies: increasing lean protein intake, meal planning and cooking strategies, keeping healthy  foods in the home, and celebration eating strategies.  Jerry Schmidt has agreed to follow-up with our clinic in 3-4 weeks. He was informed of the importance of frequent follow-up visits to maximize his success with intensive lifestyle modifications for his multiple health conditions.   Objective:   Blood pressure 115/75, pulse 86, temperature 98.1 F (36.7 C), height 5\' 10"  (1.778 m), weight 249 lb (112.9 kg), SpO2 96 %. Body mass index is 35.73 kg/m.  General: Cooperative, alert, well developed, in no acute distress. HEENT: Conjunctivae and lids unremarkable. Cardiovascular: Regular rhythm.  Lungs: Normal work of breathing. Neurologic: No focal deficits.   Lab Results  Component Value Date   CREATININE 1.01 12/20/2021   BUN 16 12/20/2021   NA 140 12/20/2021   K 4.0 12/20/2021   CL 101 12/20/2021   CO2 26 12/20/2021   Lab Results  Component Value Date   ALT 34 12/20/2021   AST 27 12/20/2021   ALKPHOS 70 12/20/2021   BILITOT 0.8 12/20/2021   Lab Results  Component Value Date   HGBA1C 5.7 (H) 12/20/2021   HGBA1C 5.5 08/28/2017   Lab Results  Component Value Date   INSULIN 15.7 12/20/2021   Lab Results  Component Value Date   TSH 1.770 12/20/2021   Lab Results  Component Value Date   CHOL 224 (H) 12/20/2021   HDL 39 (L) 12/20/2021   LDLCALC 134 (H) 12/20/2021   LDLDIRECT 141.0 08/28/2017   TRIG 284 (H) 12/20/2021   CHOLHDL 6.8 (H) 08/12/2020   Lab Results  Component Value Date   VD25OH 26.0 (L) 12/20/2021   VD25OH 28.75 (L) 08/28/2017   Lab Results  Component Value Date   WBC 6.2 12/20/2021   HGB 14.6 12/20/2021   HCT 43.0 12/20/2021   MCV 82 12/20/2021   PLT 171 12/20/2021   Attestation Statements:   Reviewed by clinician on day of visit: allergies, medications, problem list, medical history, surgical history, family history, social history, and previous encounter notes.  12/22/2021, CMA, am acting as transcriptionist for Edmund Hilda,  MD.   I have reviewed the above documentation for accuracy and completeness, and I agree with the above. - Reuben Likes, MD

## 2022-01-08 NOTE — Telephone Encounter (Signed)
RX 01/04/22 Please advise

## 2022-01-15 ENCOUNTER — Ambulatory Visit: Payer: No Typology Code available for payment source | Admitting: Family Medicine

## 2022-01-15 ENCOUNTER — Encounter: Payer: Self-pay | Admitting: Family Medicine

## 2022-01-15 ENCOUNTER — Other Ambulatory Visit: Payer: Self-pay

## 2022-01-15 VITALS — BP 120/80 | HR 82 | Temp 98.5°F | Ht 70.0 in | Wt 257.1 lb

## 2022-01-15 DIAGNOSIS — R5383 Other fatigue: Secondary | ICD-10-CM

## 2022-01-15 NOTE — Patient Instructions (Signed)
Welcome to [facility]! It was a pleasure meeting you today.  As discussed, Please schedule a 3 month follow up visit today.  B complex vitamin.   Fiber(benefiber, metamucil). Fish oil 2000mg /day.   Magnesium 400mg /day.  PLEASE NOTE:  If you had any LAB tests please let know if you have not heard back within a few days. You may see your results on MyChart before we have a chance to review them but we will give you a call once they are reviewed by . If we ordered any REFERRALS today, please let us know if you have not heard from their office within the next week.  Let us know through MyChart if you are needing REFILLS, or have your pharmacy send [place] the request. You can also use MyChart to communicate with me or any office staff.  Please try these tips to maintain a healthy lifestyle:  Eat most of your calories during the day when you are active. Eliminate processed foods including packaged sweets (pies, cakes, cookies), reduce intake of potatoes, white bread, white pasta, and white rice. Look for whole grain options, oat flour or almond flour.  Each meal should contain half fruits/vegetables, one quarter protein, and one quarter carbs (no bigger than a computer mouse).  Cut down on sweet beverages. This includes juice, soda, and sweet tea. Also watch fruit intake, though this is a healthier sweet option, it still contains natural sugar! Limit to 3 servings daily.  Drink at least 1 glass of water with each meal and aim for at least 8 glasses per day  Exercise at least 150 minutes every week.

## 2022-01-15 NOTE — Progress Notes (Signed)
Subjective:     Patient ID: Jerry Schmidt, male    DOB: August 24, 1981, 41 y.o.   MRN: [id number]  Chief Complaint  Patient presents with   Transfer of Care    Fatigue     HPI Fatigue-for 2 yrs.  Has been on vit D recently and on wt loss program and gaining wt.  No drastic loss/gain.  Phentermine gave energy but on awhile and had to stop as  wt loss clinic-EKG not normal.   Only sleeps 2 hrs/night. Works 130am to [employer].  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   Wellness clinic for 1 mo and following diet and not losing and not helping fatigue.  Snores. No apnea.  Talks in sleep. Occ ha.    Falls asleep watching TV, etc.  Not if engaged in tasks, driving.    2 yrs ago, heart flutter and w/u "fine".  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 4 capsule 0   cetirizine (ZYRTEC) 10 MG tablet Take 10 mg by mouth daily. (Patient not taking: Reported on 01/15/2022)     phentermine 37.5 MG capsule Take 1 capsule (37.5 mg total) by mouth every morning. (Patient not taking: Reported on 01/15/2022) 30 capsule 2   acetaminophen (TYLENOL) 500 MG tablet Take 2 tablets (1,000 mg total) by mouth every 8 (eight) hours as needed for moderate pain. Take 2 tabs every 8 hours. 30 tablet 0    No facility-administered medications prior to visit.    No Known Allergies ROS  Occ brbpr-wiping.  Some straining since on high protein diet.- thinks this is why some rectal bleeding  Pulled muscle in R arm-hurts to lift-did 100 push ups prior.  Good libido .  No ED       Objective:     BP 120/80    Pulse 82    Temp 98.5 F (36.9 C) (Temporal)    Ht 5\' 10"  (1.778 m)    Wt 257 lb 2 oz (116.6 kg)    SpO2 97%    BMI 36.89 kg/m  Wt Readings from Last 3 Encounters:  01/15/22 257 lb 2 oz (116.6 kg)  01/04/22 249 lb (112.9 kg)  12/20/21 251 lb (113.9 kg)        Gen: WDWN NAD OWM HEENT: NCAT, conjunctiva not injected, sclera nonicteric NECK:  supple, no thyromegaly, no nodes, no carotid bruits CARDIAC: RRR, S1S2+, no murmur. DP 2+B LUNGS: CTAB. No wheezes ABDOMEN:  BS+, soft, NTND, No HSM, no masses EXT:  no edema MSK: no gross abnormalities.  NEURO: A&O x3.  CN II-XII intact.  PSYCH:  normal mood. Good eye contact  Reviewed labs from wellness ctr. And ekg  Assessment & Plan:   Problem List Items Addressed This Visit   None Visit Diagnoses     Other fatigue    -  Primary   Relevant Orders   Testosterone      Fatigue-mutifactorial-doesn't sleep enough.  He may try to cut back on work hours.  Labs have been unremarkable.  Working on [employer].  Just started D.  Can add B complex as well and Mg for muscles and constipation.  May be sleep apnea-wants to hold off on sleep study.  May be low T.  He will return for Testosterone test.   No orders of the defined types were placed in this encounter.   Angelena Sole, MD

## 2022-01-22 ENCOUNTER — Other Ambulatory Visit: Payer: Self-pay

## 2022-01-22 ENCOUNTER — Other Ambulatory Visit (INDEPENDENT_AMBULATORY_CARE_PROVIDER_SITE_OTHER): Payer: No Typology Code available for payment source

## 2022-01-22 DIAGNOSIS — R5383 Other fatigue: Secondary | ICD-10-CM

## 2022-01-23 ENCOUNTER — Other Ambulatory Visit: Payer: Self-pay | Admitting: *Deleted

## 2022-01-23 DIAGNOSIS — R7989 Other specified abnormal findings of blood chemistry: Secondary | ICD-10-CM

## 2022-01-23 LAB — TESTOSTERONE: Testosterone: 222.64 ng/dL — ABNORMAL LOW (ref 300.00–890.00)

## 2022-01-29 ENCOUNTER — Other Ambulatory Visit: Payer: Self-pay

## 2022-01-29 ENCOUNTER — Other Ambulatory Visit (INDEPENDENT_AMBULATORY_CARE_PROVIDER_SITE_OTHER): Payer: No Typology Code available for payment source

## 2022-01-29 DIAGNOSIS — R7989 Other specified abnormal findings of blood chemistry: Secondary | ICD-10-CM

## 2022-01-29 LAB — TESTOSTERONE: Testosterone: 173.89 ng/dL — ABNORMAL LOW (ref 300.00–890.00)

## 2022-01-29 LAB — FOLLICLE STIMULATING HORMONE: FSH: 3.7 m[IU]/mL (ref 1.4–18.1)

## 2022-01-29 LAB — LUTEINIZING HORMONE: LH: 4.66 m[IU]/mL (ref 1.50–9.30)

## 2022-01-30 ENCOUNTER — Other Ambulatory Visit (INDEPENDENT_AMBULATORY_CARE_PROVIDER_SITE_OTHER): Payer: Self-pay | Admitting: Family Medicine

## 2022-01-30 DIAGNOSIS — E559 Vitamin D deficiency, unspecified: Secondary | ICD-10-CM

## 2022-02-01 ENCOUNTER — Other Ambulatory Visit: Payer: Self-pay

## 2022-02-01 ENCOUNTER — Encounter (INDEPENDENT_AMBULATORY_CARE_PROVIDER_SITE_OTHER): Payer: Self-pay | Admitting: Family Medicine

## 2022-02-01 ENCOUNTER — Ambulatory Visit (INDEPENDENT_AMBULATORY_CARE_PROVIDER_SITE_OTHER): Payer: No Typology Code available for payment source | Admitting: Family Medicine

## 2022-02-01 ENCOUNTER — Ambulatory Visit (INDEPENDENT_AMBULATORY_CARE_PROVIDER_SITE_OTHER): Payer: Self-pay | Admitting: Family Medicine

## 2022-02-01 VITALS — BP 138/77 | HR 82 | Temp 97.9°F | Ht 70.0 in | Wt 241.0 lb

## 2022-02-01 DIAGNOSIS — E669 Obesity, unspecified: Secondary | ICD-10-CM

## 2022-02-01 DIAGNOSIS — Z6836 Body mass index (BMI) 36.0-36.9, adult: Secondary | ICD-10-CM

## 2022-02-01 DIAGNOSIS — E559 Vitamin D deficiency, unspecified: Secondary | ICD-10-CM

## 2022-02-01 DIAGNOSIS — Z6834 Body mass index (BMI) 34.0-34.9, adult: Secondary | ICD-10-CM

## 2022-02-01 MED ORDER — VITAMIN D (ERGOCALCIFEROL) 1.25 MG (50000 UNIT) PO CAPS: 50000.0000 [IU] | ORAL_CAPSULE | ORAL | 0 refills | Status: DC

## 2022-02-02 LAB — PROLACTIN: Prolactin: 6.9 ng/mL (ref 2.0–18.0)

## 2022-02-02 LAB — TESTOSTERONE, FREE: TESTOSTERONE FREE: 37 pg/mL — ABNORMAL LOW (ref 46.0–224.0)

## 2022-02-04 NOTE — Progress Notes (Unsigned)
Chief Complaint:   OBESITY Jerry Schmidt is here to discuss his progress with his obesity treatment plan along with follow-up of his obesity related diagnoses. Jerry Schmidt is on keeping a food journal and adhering to recommended goals of 2000-2200 calories and 145+ grams of protein daily and states he is following his eating plan approximately 85% of the time. Jerry Schmidt states he is doing 100 push-ups 3 times per week.   Today's visit was #: 3 Starting weight: 251 lbs Starting date: 12/20/2021 Today's weight: 241 lbs Today's date: 02/01/2022 Total lbs lost to date: 10 Total lbs lost since last in-office visit: 8  Interim History: Jerry Schmidt has seen a new doctor and was diagnosed with low testosterone. He is feeling very fatigued. He is doing really well with journaling and keeping track of food. He logs pre-emptively. He has added some snack calories to his meals to get all the food in. He did eat out to a place that used significant amount of butter.  Subjective:   1. Vitamin D deficiency Jerry Schmidt is on prescription Vit D. He denies nausea, vomiting, or muscle weakness, but notes fatigue.  Assessment/Plan:   1. Vitamin D deficiency We will refill prescription Vitamin D 50,000 IU every week for 1 month. Jerry Schmidt will follow-up for routine testing of Vitamin D, at least 2-3 times per year to avoid over-replacement.  - Vitamin D, Ergocalciferol, (DRISDOL) 1.25 MG (50000 UNIT) CAPS capsule; Take 1 capsule (50,000 Units total) by mouth every 7 (seven) days.  Dispense: 4 capsule; Refill: 0  2. Obesity with current BMI of 34.6 Jerry Schmidt is currently in the action stage of change. As such, his goal is to continue with weight loss efforts. He has agreed to keeping a food journal and adhering to recommended goals of 2000-2200 calories and 145+ grams of protein daily.   Exercise goals: All adults should avoid inactivity. Some physical activity is better than none, and adults who  participate in any amount of physical activity gain some health benefits.  Behavioral modification strategies: increasing lean protein intake, meal planning and cooking strategies, planning for success, and keeping a strict food journal.  Jerry Schmidt has agreed to follow-up with our clinic in 4 weeks. He was informed of the importance of frequent follow-up visits to maximize his success with intensive lifestyle modifications for his multiple health conditions.   Objective:   Blood pressure 138/77, pulse 82, temperature 97.9 F (36.6 C), height 5\' 10"  (1.778 m), weight 241 lb (109.3 kg), SpO2 97 %. Body mass index is 34.58 kg/m.  General: Cooperative, alert, well developed, in no acute distress. HEENT: Conjunctivae and lids unremarkable. Cardiovascular: Regular rhythm.  Lungs: Normal work of breathing. Neurologic: No focal deficits.   Lab Results  Component Value Date   CREATININE 1.01 12/20/2021   BUN 16 12/20/2021   NA 140 12/20/2021   K 4.0 12/20/2021   CL 101 12/20/2021   CO2 26 12/20/2021   Lab Results  Component Value Date   ALT 34 12/20/2021   AST 27 12/20/2021   ALKPHOS 70 12/20/2021   BILITOT 0.8 12/20/2021   Lab Results  Component Value Date   HGBA1C 5.7 (H) 12/20/2021   HGBA1C 5.5 08/28/2017   Lab Results  Component Value Date   INSULIN 15.7 12/20/2021   Lab Results  Component Value Date   TSH 1.770 12/20/2021   Lab Results  Component Value Date   CHOL 224 (H) 12/20/2021   HDL 39 (L) 12/20/2021   LDLCALC 134 (H)  12/20/2021   LDLDIRECT 141.0 08/28/2017   TRIG 284 (H) 12/20/2021   CHOLHDL 6.8 (H) 08/12/2020   Lab Results  Component Value Date   VD25OH 26.0 (L) 12/20/2021   VD25OH 28.75 (L) 08/28/2017   Lab Results  Component Value Date   WBC 6.2 12/20/2021   HGB 14.6 12/20/2021   HCT 43.0 12/20/2021   MCV 82 12/20/2021   PLT 171 12/20/2021   No results found for: IRON, TIBC, FERRITIN  Attestation Statements:   Reviewed by clinician on  day of visit: allergies, medications, problem list, medical history, surgical history, family history, social history, and previous encounter notes.   I, Trixie Dredge, am acting as transcriptionist for Coralie Common, MD.  I have reviewed the above documentation for accuracy and completeness, and I agree with the above. -  ***

## 2022-02-05 ENCOUNTER — Other Ambulatory Visit: Payer: Self-pay

## 2022-02-05 ENCOUNTER — Encounter: Payer: Self-pay | Admitting: Family Medicine

## 2022-02-05 ENCOUNTER — Telehealth (INDEPENDENT_AMBULATORY_CARE_PROVIDER_SITE_OTHER): Payer: No Typology Code available for payment source | Admitting: Family Medicine

## 2022-02-05 DIAGNOSIS — R7989 Other specified abnormal findings of blood chemistry: Secondary | ICD-10-CM

## 2022-02-05 MED ORDER — "LUER LOCK SAFETY SYRINGES 22G X 1-1/2"" 3 ML MISC": 1.0000 | 3 refills | Status: DC

## 2022-02-05 MED ORDER — TESTOSTERONE CYPIONATE 200 MG/ML IM SOLN: 200.0000 mg | INTRAMUSCULAR | 3 refills | Status: DC

## 2022-02-05 NOTE — Patient Instructions (Addendum)
It was very nice to see you today! ? ?Hopefully the testosterone helps.  Pick it up at pharmacy and bring to appt. ? ? ?PLEASE NOTE: ? ?If you had any lab tests please let us know if you have not heard back within a few days. You may see your results on MyChart before we have a chance to review them but we will give you a call once they are reviewed by [place]. If we ordered any referrals today, please let us know if you have not heard from their office within the next week.  ? ?Please try these tips to maintain a healthy lifestyle: ? ?Eat most of your calories during the day when you are active. Eliminate processed foods including packaged sweets (pies, cakes, cookies), reduce intake of potatoes, white bread, white pasta, and white rice. Look for whole grain options, oat flour or almond flour. ? ?Each meal should contain half fruits/vegetables, one quarter protein, and one quarter carbs (no bigger than a computer mouse). ? ?Cut down on sweet beverages. This includes juice, soda, and sweet tea. Also watch fruit intake, though this is a healthier sweet option, it still contains natural sugar! Limit to 3 servings daily. ? ?Drink at least 1 glass of water with each meal and aim for at least 8 glasses per day ? ?Exercise at least 150 minutes every week.   ?

## 2022-02-05 NOTE — Progress Notes (Signed)
? ? ?MyChart Video Visit ? ? ? ?Virtual Visit via Video Note  ? ?This visit type was conducted due to national recommendations for restrictions regarding the COVID-19 Pandemic (e.g. social distancing) in an effort to limit this patient's exposure and mitigate transmission in our community. This patient is at least at moderate risk for complications without adequate follow up. This format is felt to be most appropriate for this patient at this time. Physical exam was limited by quality of the video and audio technology used for the visit. CMA was able to get the patient set up on a video visit. ? ?Patient location: Home. Patient and provider in visit ?Provider location: Office ? ?I discussed the limitations of evaluation and management by telemedicine and the availability of in person appointments. The patient expressed understanding and agreed to proceed. ? ?Visit Date: 02/05/2022 ? ?Today's healthcare provider: Angelena Sole, MD  ? ? ? ?Subjective:  ? ? Patient ID: Jerry Schmidt, male    DOB: 12/19/1980, 41 y.o.   MRN: [id number] ? ?Chief Complaint  ?Patient presents with  ? Follow-up  ?  Discuss previous results of low testosterone  ? ? ?HPI ?Low Testosterone ?Fatigue getting worse since doing diet. Seeing wt loss clinic and discussed Low T w/them as well.  Agree to plan.  ? ?Past Medical History:  ?Diagnosis Date  ? Back injury 02/25/2014  ? Back pain   ? Fatty liver   ? Joint pain   ? Kidney stone   ? Prediabetes   ? Seasonal allergies   ? ? ?Past Surgical History:  ?Procedure Laterality Date  ? HAND SURGERY Left 2002  ? 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 ? ?Outpatient Medications Prior to Visit  ?Medication Sig Dispense Refill  ? cetirizine (ZYRTEC) 10 MG tablet Take 10 mg by mouth daily.    ? Vitamin D, Ergocalciferol, (DRISDOL) 1.25 MG (50000 UNIT) CAPS capsule Take 1 capsule (50,000 Units total) by mouth every 7 (seven) days. 4 capsule 0  ? ?No facility-administered medications prior to visit.   ? ? ?No Known Allergies ? ? ?   ?Objective:  ?  ? ?Physical Exam  ?Vitals and nursing note reviewed.  ?Constitutional:   ?   General:  is not in acute distress. ?   Appearance: Normal appearance.  ?HENT:  ?   Head: Normocephalic.  ?Pulmonary:  ?   Effort: No respiratory distress.  ?Skin: ?   General: Skin is dry.  ?   Coloration: Skin is not pale.  ?Neurological:  ?   Mental Status: Pt is alert and oriented to person, place, and time.  ?Psychiatric:     ?   Mood and Affect: Mood normal.  ? ?There were no vitals taken for this visit. ? ?Wt Readings from Last 3 Encounters:  ?02/01/22 241 lb (109.3 kg)  ?01/15/22 257 lb 2 oz (116.6 kg)  ?01/04/22 249 lb (112.9 kg)  ? ? ?   ?Assessment & Plan:  ? ?Problem List Items Addressed This Visit   ?None ?Visit Diagnoses   ? ? Low testosterone in male    -  Primary  ? ?  ? Low T-discussed pros/cons/risks/benefits.  Pt would like to have wife do injections.  Discussed need to large muscle(glut) so needs to be taught.  Would like a nurse visit on a Tuesday afternoon.  Will do labs in 3 months.  PDMP reviewed today, no red flags.   ? ?No orders of  the defined types were placed in this encounter. ? ? ?I discussed the assessment and treatment plan with the patient. The patient was provided an opportunity to ask questions and all were answered. The patient agreed with the plan and demonstrated an understanding of the instructions. ?  ?The patient was advised to call back or seek an in-person evaluation if the symptoms worsen or if the condition fails to improve as anticipated. ? ?I provided 15 minutes of face-to-face time during this encounter. ? ? ?Angelena Sole, MD ?[facility] ?[phone] (phone) ?[phone] (fax) ? ?[facility]  ?

## 2022-02-06 ENCOUNTER — Ambulatory Visit (INDEPENDENT_AMBULATORY_CARE_PROVIDER_SITE_OTHER): Payer: No Typology Code available for payment source

## 2022-02-06 DIAGNOSIS — R7989 Other specified abnormal findings of blood chemistry: Secondary | ICD-10-CM

## 2022-02-06 MED ORDER — TESTOSTERONE CYPIONATE 200 MG/ML IM SOLN
200.0000 mg | INTRAMUSCULAR | Status: DC
  Administered 2022-02-06: 200 mg via INTRAMUSCULAR

## 2022-02-06 NOTE — Progress Notes (Signed)
Jerry Schmidt 41 yr old male presents to office today for testosterone injection per Aris Georgia, MD. Administered testosterone 200 mg left ventrogluteal. Patient tolerated well.  ?

## 2022-02-27 NOTE — Progress Notes (Signed)
Jerry Schmidt, CMA Certified Medical Assistant Signed    ?02/06/2022 ?Dom Haverland 41 yr old male presents to office today for testosterone injection per Aris Georgia, MD. Administered testosterone 200 mg left ventrogluteal. Patient tolerated well.  ? ? ? ?

## 2022-03-01 ENCOUNTER — Ambulatory Visit (INDEPENDENT_AMBULATORY_CARE_PROVIDER_SITE_OTHER): Payer: No Typology Code available for payment source | Admitting: Family Medicine

## 2022-03-01 ENCOUNTER — Encounter (INDEPENDENT_AMBULATORY_CARE_PROVIDER_SITE_OTHER): Payer: Self-pay | Admitting: Family Medicine

## 2022-03-01 VITALS — BP 125/83 | HR 88 | Temp 97.8°F | Ht 70.0 in | Wt 241.0 lb

## 2022-03-01 DIAGNOSIS — Z6834 Body mass index (BMI) 34.0-34.9, adult: Secondary | ICD-10-CM

## 2022-03-01 DIAGNOSIS — E559 Vitamin D deficiency, unspecified: Secondary | ICD-10-CM

## 2022-03-01 DIAGNOSIS — E349 Endocrine disorder, unspecified: Secondary | ICD-10-CM | POA: Diagnosis not present

## 2022-03-01 DIAGNOSIS — E669 Obesity, unspecified: Secondary | ICD-10-CM

## 2022-03-01 MED ORDER — VITAMIN D (ERGOCALCIFEROL) 1.25 MG (50000 UNIT) PO CAPS: 50000.0000 [IU] | ORAL_CAPSULE | ORAL | 0 refills | Status: DC

## 2022-03-05 NOTE — Progress Notes (Signed)
? ? ? ?Chief Complaint:  ? ?OBESITY ?Jerry Schmidt is here to discuss his progress with his obesity treatment plan along with follow-up of his obesity related diagnoses. Jerry Schmidt is on keeping a food journal and adhering to recommended goals of 2000-2200 calories and 145 plus grams of protein and states he is following his eating plan approximately 90% of the time. Jerry Schmidt states he is doing push ups 100 per day and situps 15-20 minutes 3 times per week. ? ?Today's visit was #: 4 ?Starting weight: 251 lbs ?Starting date: 12/20/2021 ?Today's weight: 241 lbs ?Today's date: 03/01/2022 ?Total lbs lost to date: 10 lbs ?Total lbs lost since last in-office visit: 0 ? ?Interim History: Jerry Schmidt has started working out to try to stay toned while losing weight. He is noticing non scale victories. His energy levels have improved since starting testosterone. He is feeling bored doing same food over and over. His kids is not enjoying much if any vegetables.  ? ?Subjective:  ? ?1. Vitamin D deficiency ?Jerry Schmidt's last Vitamin D level was 26.0. ? ?2. Testosterone deficiency ?Jerry Schmidt is now receiving IM depot. His fatigue is improving.  ? ?Assessment/Plan:  ? ?1. Vitamin D deficiency ?Low Vitamin D level contributes to fatigue and are associated with obesity, breast, and colon cancer. We will refill prescription Vitamin D 50,000 IU every week for 1 month with no refills and Cervando will follow-up for routine testing of Vitamin D, at least 2-3 times per year to avoid over-replacement. ? ?- Vitamin D, Ergocalciferol, (DRISDOL) 1.25 MG (50000 UNIT) CAPS capsule; Take 1 capsule (50,000 Units total) by mouth every 7 (seven) days.  Dispense: 4 capsule; Refill: 0 ? ?2. Testosterone deficiency ?Jerry Schmidt will continue injections. He will need repeats CBC in 3 months.  ? ?3. Obesity with current BMI of 34.7 ?Jerry Schmidt is currently in the action stage of change. As such, his goal is to continue with weight loss efforts.  He has agreed to keeping a food journal and adhering to recommended goals of 2000-2200 calories and 145 plus grams of protein daily.  ? ?Exercise goals: All adults should avoid inactivity. Some physical activity is better than none, and adults who participate in any amount of physical activity gain some health benefits. Jerry Schmidt was give additional recipes today.  ? ?Behavioral modification strategies: increasing lean protein intake, meal planning and cooking strategies, keeping healthy foods in the home, and planning for success. ? ?Jerry Schmidt has agreed to follow-up with our clinic in 4 weeks. He was informed of the importance of frequent follow-up visits to maximize his success with intensive lifestyle modifications for his multiple health conditions.  ? ?Objective:  ? ?Blood pressure 125/83, pulse 88, temperature 97.8 ?F (36.6 ?C), height 5\' 10"  (1.778 m), weight 241 lb (109.3 kg), SpO2 96 %. ?Body mass index is 34.58 kg/m?. ? ?General: Cooperative, alert, well developed, in no acute distress. ?HEENT: Conjunctivae and lids unremarkable. ?Cardiovascular: Regular rhythm.  ?Lungs: Normal work of breathing. ?Neurologic: No focal deficits.  ? ?Lab Results  ?Component Value Date  ? CREATININE 1.01 12/20/2021  ? BUN 16 12/20/2021  ? NA 140 12/20/2021  ? K 4.0 12/20/2021  ? CL 101 12/20/2021  ? CO2 26 12/20/2021  ? ?Lab Results  ?Component Value Date  ? ALT 34 12/20/2021  ? AST 27 12/20/2021  ? ALKPHOS 70 12/20/2021  ? BILITOT 0.8 12/20/2021  ? ?Lab Results  ?Component Value Date  ? HGBA1C 5.7 (H) 12/20/2021  ? HGBA1C 5.5 08/28/2017  ? ?Lab Results  ?  Component Value Date  ? INSULIN 15.7 12/20/2021  ? ?Lab Results  ?Component Value Date  ? TSH 1.770 12/20/2021  ? ?Lab Results  ?Component Value Date  ? CHOL 224 (H) 12/20/2021  ? HDL 39 (L) 12/20/2021  ? LDLCALC 134 (H) 12/20/2021  ? LDLDIRECT 141.0 08/28/2017  ? TRIG 284 (H) 12/20/2021  ? CHOLHDL 6.8 (H) 08/12/2020  ? ?Lab Results  ?Component Value Date  ? VD25OH 26.0  (L) 12/20/2021  ? VD25OH 28.75 (L) 08/28/2017  ? ?Lab Results  ?Component Value Date  ? WBC 6.2 12/20/2021  ? HGB 14.6 12/20/2021  ? HCT 43.0 12/20/2021  ? MCV 82 12/20/2021  ? PLT 171 12/20/2021  ? ?No results found for: IRON, TIBC, FERRITIN ? ?Attestation Statements:  ? ?Reviewed by clinician on day of visit: allergies, medications, problem list, medical history, surgical history, family history, social history, and previous encounter notes. ? ?I, Jackson Latino, RMA, am acting as transcriptionist for Jerry Likes, MD.  ? ?I have reviewed the above documentation for accuracy and completeness, and I agree with the above. Jerry Likes, MD ? ?

## 2022-03-26 ENCOUNTER — Other Ambulatory Visit (INDEPENDENT_AMBULATORY_CARE_PROVIDER_SITE_OTHER): Payer: Self-pay | Admitting: Family Medicine

## 2022-03-26 DIAGNOSIS — E559 Vitamin D deficiency, unspecified: Secondary | ICD-10-CM

## 2022-04-03 ENCOUNTER — Telehealth (INDEPENDENT_AMBULATORY_CARE_PROVIDER_SITE_OTHER): Payer: No Typology Code available for payment source | Admitting: Family Medicine

## 2022-04-03 ENCOUNTER — Encounter (INDEPENDENT_AMBULATORY_CARE_PROVIDER_SITE_OTHER): Payer: Self-pay | Admitting: Family Medicine

## 2022-04-03 VITALS — Ht 70.0 in | Wt 238.0 lb

## 2022-04-03 DIAGNOSIS — E669 Obesity, unspecified: Secondary | ICD-10-CM

## 2022-04-03 DIAGNOSIS — Z6834 Body mass index (BMI) 34.0-34.9, adult: Secondary | ICD-10-CM | POA: Diagnosis not present

## 2022-04-03 DIAGNOSIS — E559 Vitamin D deficiency, unspecified: Secondary | ICD-10-CM

## 2022-04-03 DIAGNOSIS — R7303 Prediabetes: Secondary | ICD-10-CM

## 2022-04-03 MED ORDER — VITAMIN D (ERGOCALCIFEROL) 1.25 MG (50000 UNIT) PO CAPS: 50000.0000 [IU] | ORAL_CAPSULE | ORAL | 0 refills | Status: DC

## 2022-04-11 NOTE — Progress Notes (Signed)
TeleHealth Visit:  Due to the COVID-19 pandemic, this visit was completed with telemedicine (audio/video) technology to reduce patient and provider exposure as well as to preserve personal protective equipment.   Jael has verbally consented to this TeleHealth visit. The patient is located at home, the provider is located at the home. The participants in this visit include the listed provider and patient. The visit was conducted today via [employer] video.   Chief Complaint: OBESITY Deron is here to discuss his progress with his obesity treatment plan along with follow-up of his obesity related diagnoses. Esko is on keeping a food journal and adhering to recommended goals of 2000-2300 calories and 145+ grams of protein and states he is following his eating plan approximately 90% of the time. Chancelor states he is doing sit ups and push up 100 a day 3 times per week.  Today's visit was #: 5 Starting weight: 251 lbs Starting date: 12/20/2021  Interim History: Tavi voices he has lost 3 lbs on office scale. He has been staying between 2000-2300 calories per day. Aiming to 40% of daily macros for protein. He can tell when testosterone starts to wear off. He did have a 48 hr bug recently with fever and GI upset. He has no plans this month.  Subjective:   1. Vitamin D deficiency Nickalas is currently taking prescription Vit D. His last Vit D level of 26.0.  2. Prediabetes Chirstopher's last A1c of 5.7, insulin of 15.7. He is not currently taking any GLP-1. He will see his PCP at the end of this month.  Assessment/Plan:   1. Vitamin D deficiency We will refill Vit D 50K IU weekly for 1 month with zero refills.  -Refill Vitamin D, Ergocalciferol, (DRISDOL) 1.25 MG (50000 UNIT) CAPS capsule; Take 1 capsule (50,000 Units total) by mouth every 7 (seven) days.  Dispense: 4 capsule; Refill: 0  2. Prediabetes Labs performed at the end of June.  3. Obesity with  current BMI of 34.3 Xuan is currently in the action stage of change. As such, his goal is to continue with weight loss efforts. He has agreed to keeping a food journal and adhering to recommended goals of 2000-2300 calories and 145+ grams of protein daily.  Exercise goals: As is.  Behavioral modification strategies: increasing lean protein intake, meal planning and cooking strategies, keeping healthy foods in the home, and keeping a strict food journal.  Breylon has agreed to follow-up with our clinic in 4 weeks. He was informed of the importance of frequent follow-up visits to maximize his success with intensive lifestyle modifications for his multiple health conditions.  Objective:   VITALS: Per patient if applicable, see vitals. GENERAL: Alert and in no acute distress. CARDIOPULMONARY: No increased WOB. Speaking in clear sentences.  PSYCH: Pleasant and cooperative. Speech normal rate and rhythm. Affect is appropriate. Insight and judgement are appropriate. Attention is focused, linear, and appropriate.  NEURO: Oriented as arrived to appointment on time with no prompting.   Lab Results  Component Value Date   CREATININE 1.01 12/20/2021   BUN 16 12/20/2021   NA 140 12/20/2021   K 4.0 12/20/2021   CL 101 12/20/2021   CO2 26 12/20/2021   Lab Results  Component Value Date   ALT 34 12/20/2021   AST 27 12/20/2021   ALKPHOS 70 12/20/2021   BILITOT 0.8 12/20/2021   Lab Results  Component Value Date   HGBA1C 5.7 (H) 12/20/2021   HGBA1C 5.5 08/28/2017   Lab Results  Component Value Date   INSULIN 15.7 12/20/2021   Lab Results  Component Value Date   TSH 1.770 12/20/2021   Lab Results  Component Value Date   CHOL 224 (H) 12/20/2021   HDL 39 (L) 12/20/2021   LDLCALC 134 (H) 12/20/2021   LDLDIRECT 141.0 08/28/2017   TRIG 284 (H) 12/20/2021   CHOLHDL 6.8 (H) 08/12/2020   Lab Results  Component Value Date   VD25OH 26.0 (L) 12/20/2021   VD25OH 28.75 (L)  08/28/2017   Lab Results  Component Value Date   WBC 6.2 12/20/2021   HGB 14.6 12/20/2021   HCT 43.0 12/20/2021   MCV 82 12/20/2021   PLT 171 12/20/2021   No results found for: IRON, TIBC, FERRITIN  Attestation Statements:   Reviewed by clinician on day of visit: allergies, medications, problem list, medical history, surgical history, family history, social history, and previous encounter notes.  I, Elnora Morrison, RMA am acting as transcriptionist for Coralie Common, MD.  I have reviewed the above documentation for accuracy and completeness, and I agree with the above. - Coralie Common, MD

## 2022-04-16 ENCOUNTER — Ambulatory Visit (INDEPENDENT_AMBULATORY_CARE_PROVIDER_SITE_OTHER): Payer: No Typology Code available for payment source | Admitting: Family Medicine

## 2022-04-16 ENCOUNTER — Encounter: Payer: Self-pay | Admitting: Family Medicine

## 2022-04-16 VITALS — BP 140/90 | HR 83 | Temp 98.3°F | Ht 70.0 in | Wt 251.0 lb

## 2022-04-16 DIAGNOSIS — R7303 Prediabetes: Secondary | ICD-10-CM

## 2022-04-16 DIAGNOSIS — E349 Endocrine disorder, unspecified: Secondary | ICD-10-CM | POA: Insufficient documentation

## 2022-04-16 DIAGNOSIS — E669 Obesity, unspecified: Secondary | ICD-10-CM | POA: Diagnosis not present

## 2022-04-16 DIAGNOSIS — R7989 Other specified abnormal findings of blood chemistry: Secondary | ICD-10-CM

## 2022-04-16 NOTE — Patient Instructions (Signed)
It was very nice to see you today!  Keep up the good work   PLEASE NOTE:  If you had any lab tests please let us know if you have not heard back within a few days. You may see your results on MyChart before we have a chance to review them but we will give you a call once they are reviewed by us. If we ordered any referrals today, please let us know if you have not heard from their office within the next week.   Please try these tips to maintain a healthy lifestyle:  Eat most of your calories during the day when you are active. Eliminate processed foods including packaged sweets (pies, cakes, cookies), reduce intake of potatoes, white bread, white pasta, and white rice. Look for whole grain options, oat flour or almond flour.  Each meal should contain half fruits/vegetables, one quarter protein, and one quarter carbs (no bigger than a computer mouse).  Cut down on sweet beverages. This includes juice, soda, and sweet tea. Also watch fruit intake, though this is a healthier sweet option, it still contains natural sugar! Limit to 3 servings daily.  Drink at least 1 glass of water with each meal and aim for at least 8 glasses per day  Exercise at least 150 minutes every week.   

## 2022-04-16 NOTE — Progress Notes (Signed)
Subjective:     Patient ID: Jerry Schmidt, male    DOB: 03-Mar-1981, 41 y.o.   MRN: [id number]  Chief Complaint  Patient presents with   Follow-up    3 month follow-up    HPI  preDM-working on TLC.  [facility] on [facility].   Low T-last injection-5/9   can tell when time to do next injection.  Feeling better first wk but by next wk, feeling fatigued and less energetic. Wife. No acne. No SI. No rage.   Ate 11am(5 hrs ago) Obesity-going to wt loss clinic.  Down 14# on their scale.  Pt wearing steel toed boots, etc today.  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     SYRINGE-NEEDLE, DISP, 3 ML (LUER LOCK SAFETY SYRINGES) 22G X 1-1/2" 3 ML MISC 1 Syringe by Does not apply route every 14 (fourteen) days. 10 each 3   testosterone cypionate (DEPO-TESTOSTERONE) 200 MG/ML injection Inject 1 mL (200 mg total) into the muscle every 14 (fourteen) days. 2 mL 3   Vitamin D, Ergocalciferol, (DRISDOL) 1.25 MG (50000 UNIT) CAPS capsule Take 1 capsule (50,000 Units total) by mouth every 7 (seven) days. 4 capsule 0   testosterone cypionate (DEPOTESTOSTERONE CYPIONATE) injection 200 mg      No facility-administered medications prior to visit.    No Known Allergies ROS neg/noncontributory except as noted HPI/below      Objective:     BP 140/90   Pulse 83   Temp 98.3 F (36.8 C) (Temporal)   Ht 5\' 10"  (1.778 m)   Wt 251 lb (113.9 kg)   SpO2 97%   BMI 36.01 kg/m  Wt Readings from Last 3  Encounters:  04/16/22 251 lb (113.9 kg)  04/03/22 238 lb (108 kg)  03/01/22 241 lb (109.3 kg)    Physical Exam   Gen: WDWN NAD OWM HEENT: NCAT, conjunctiva not injected, sclera nonicteric NECK:  supple, no thyromegaly, no nodes, no carotid bruits CARDIAC: RRR, S1S2+, no murmur. DP 2+B LUNGS: CTAB. No wheezes ABDOMEN:  BS+, soft, NTND, No HSM, no masses EXT:  no edema MSK: no gross abnormalities.  NEURO: A&O x3.  CN II-XII intact.  PSYCH: normal mood. Good eye contact     Assessment & Plan:   Problem List Items Addressed This Visit       Other   Obesity (BMI 30.0-34.9)   Testosterone deficiency   Other Visit Diagnoses     Prediabetes    -  Primary   Low testosterone in male          PreDM-working on TLC-check A1C Testosterone deficiency-feeling better on replacement but wearing off after 1 wk.  Will check testosterone,cbc,cmp,lipids.  If level ok, can do 1/2 dose weekly(so 100mg  weekly).  If level too low, will adjust.  PDMP aware checked-appropriate. Obesity-working w/wellness ctr-has lost 14#.  Thinks wt up today d/t clothing,steel toed boots, etc.  Cont TLC.   No orders of the defined types were placed in this encounter.   Wellington Hampshire, MD

## 2022-04-17 LAB — COMPREHENSIVE METABOLIC PANEL
ALT: 28 U/L (ref 0–53)
AST: 17 U/L (ref 0–37)
Albumin: 4.2 g/dL (ref 3.5–5.2)
Alkaline Phosphatase: 49 U/L (ref 39–117)
BUN: 14 mg/dL (ref 6–23)
CO2: 30 mEq/L (ref 19–32)
Calcium: 9.5 mg/dL (ref 8.4–10.5)
Chloride: 103 mEq/L (ref 96–112)
Creatinine, Ser: 1.39 mg/dL (ref 0.40–1.50)
GFR: 63.96 mL/min (ref >60.00)
Glucose, Bld: 86 mg/dL (ref 70–99)
Potassium: 4.1 mEq/L (ref 3.5–5.1)
Sodium: 139 mEq/L (ref 135–145)
Total Bilirubin: 0.7 mg/dL (ref 0.2–1.2)
Total Protein: 6.6 g/dL (ref 6.0–8.3)

## 2022-04-17 LAB — LIPID PANEL
Cholesterol: 168 mg/dL (ref 0–200)
HDL: 26.7 mg/dL — ABNORMAL LOW (ref >39.00)
NonHDL: 141.25
Total CHOL/HDL Ratio: 6
Triglycerides: 370 mg/dL — ABNORMAL HIGH (ref 0.0–149.0)
VLDL: 74 mg/dL — ABNORMAL HIGH (ref 0.0–40.0)

## 2022-04-17 LAB — CBC
HCT: 43.2 % (ref 39.0–52.0)
Hemoglobin: 14.5 g/dL (ref 13.0–17.0)
MCHC: 33.6 g/dL (ref 30.0–36.0)
MCV: 81.2 fl (ref 78.0–100.0)
Platelets: 178 10*3/uL (ref 150.0–400.0)
RBC: 5.32 Mil/uL (ref 4.22–5.81)
RDW: 13.7 % (ref 11.5–15.5)
WBC: 7.7 10*3/uL (ref 4.0–10.5)

## 2022-04-17 LAB — LDL CHOLESTEROL, DIRECT: Direct LDL: 102 mg/dL

## 2022-04-17 LAB — HEMOGLOBIN A1C: Hgb A1c MFr Bld: 5.7 % (ref 4.6–6.5)

## 2022-04-17 LAB — TESTOSTERONE: Testosterone: 675.25 ng/dL (ref 300.00–890.00)

## 2022-04-18 ENCOUNTER — Other Ambulatory Visit: Payer: Self-pay | Admitting: Family Medicine

## 2022-04-18 MED ORDER — TESTOSTERONE CYPIONATE 200 MG/ML IM SOLN: 100.0000 mg | INTRAMUSCULAR | 5 refills | Status: DC

## 2022-05-02 ENCOUNTER — Encounter (INDEPENDENT_AMBULATORY_CARE_PROVIDER_SITE_OTHER): Payer: Self-pay | Admitting: Family Medicine

## 2022-05-02 ENCOUNTER — Ambulatory Visit (INDEPENDENT_AMBULATORY_CARE_PROVIDER_SITE_OTHER): Payer: No Typology Code available for payment source | Admitting: Family Medicine

## 2022-05-02 VITALS — BP 108/74 | HR 105 | Temp 98.2°F | Ht 70.0 in | Wt 238.0 lb

## 2022-05-02 DIAGNOSIS — E669 Obesity, unspecified: Secondary | ICD-10-CM

## 2022-05-02 DIAGNOSIS — E559 Vitamin D deficiency, unspecified: Secondary | ICD-10-CM | POA: Diagnosis not present

## 2022-05-02 DIAGNOSIS — E7849 Other hyperlipidemia: Secondary | ICD-10-CM | POA: Diagnosis not present

## 2022-05-02 DIAGNOSIS — Z6834 Body mass index (BMI) 34.0-34.9, adult: Secondary | ICD-10-CM

## 2022-05-02 DIAGNOSIS — Z9189 Other specified personal risk factors, not elsewhere classified: Secondary | ICD-10-CM

## 2022-05-02 MED ORDER — VITAMIN D (ERGOCALCIFEROL) 1.25 MG (50000 UNIT) PO CAPS: 50000.0000 [IU] | ORAL_CAPSULE | ORAL | 0 refills | Status: DC
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
## 2022-05-30 ENCOUNTER — Encounter (INDEPENDENT_AMBULATORY_CARE_PROVIDER_SITE_OTHER): Payer: Self-pay | Admitting: Family Medicine

## 2022-05-30 ENCOUNTER — Ambulatory Visit (INDEPENDENT_AMBULATORY_CARE_PROVIDER_SITE_OTHER): Payer: No Typology Code available for payment source | Admitting: Family Medicine

## 2022-05-30 VITALS — BP 114/78 | HR 83 | Temp 97.7°F | Ht 70.0 in | Wt 230.0 lb

## 2022-05-30 DIAGNOSIS — E785 Hyperlipidemia, unspecified: Secondary | ICD-10-CM | POA: Diagnosis not present

## 2022-05-30 DIAGNOSIS — E669 Obesity, unspecified: Secondary | ICD-10-CM

## 2022-05-30 DIAGNOSIS — E559 Vitamin D deficiency, unspecified: Secondary | ICD-10-CM | POA: Diagnosis not present

## 2022-05-30 DIAGNOSIS — Z6833 Body mass index (BMI) 33.0-33.9, adult: Secondary | ICD-10-CM

## 2022-05-30 MED ORDER — VITAMIN D (ERGOCALCIFEROL) 1.25 MG (50000 UNIT) PO CAPS: 50000.0000 [IU] | ORAL_CAPSULE | ORAL | 0 refills | Status: DC

## 2022-05-31 NOTE — Progress Notes (Signed)
Chief Complaint:   OBESITY Jerry Schmidt is here to discuss his progress with his obesity treatment plan along with follow-up of his obesity related diagnoses. Jerry Schmidt is on keeping a food journal and adhering to recommended goals of 2000-2800 calories and 145+ grams of protein and states he is following his eating plan approximately 100% of the time. Jerry Schmidt states he is exercising 0 minutes 0 times per week.  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 He has had more time to prep meals and stay within calorie allotment. Went to pool and out to eat for 4th of July. Wants to stick to meal plan over the next few weeks.  Subjective:   1. Vitamin D deficiency Jerry Schmidt is currently taking prescription Vit D 50,000 IU once a week. Denies any nausea, vomiting or muscle weakness. He notes fatigue. His last Vit D level of 26.0.  2. Dyslipidemia Jerry Schmidt's :ast HDL of 26.7, Trigly of 370 and LDL of 102. He is not on medication.  Assessment/Plan:   1. Vitamin D deficiency We will refill Vit D 50,000 IU once a week for 1 month with 0 refills.  -Refill Vitamin D, Ergocalciferol, (DRISDOL) 1.25 MG (50000 UNIT) CAPS capsule; Take 1 capsule (50,000 Units total) by mouth every 7 (seven) days.  Dispense: 4 capsule; Refill: 0  2. Dyslipidemia Will repeat labs in Sept 2023.  3. Obesity with current BMI of 33.1 Jerry Schmidt is currently in the action stage of change. As such, his goal is to continue with weight loss efforts. He has agreed to keeping a food journal and adhering to recommended goals of 2000-2800 calories and 140+ grams of protein daily.   Exercise goals: All adults should avoid inactivity. Some physical activity is better than none,  and adults who participate in any amount of physical activity gain some health benefits.  Behavioral modification strategies: increasing lean protein intake, meal planning and cooking strategies, keeping healthy foods in the home, and planning for success.  Emmauel has agreed to follow-up with our clinic in 5 weeks. He was informed of the importance of frequent follow-up visits to maximize his success with intensive lifestyle modifications for his multiple health conditions.   Objective:   Blood pressure 114/78, pulse 83, temperature 97.7 F (36.5 C), height 5\' 10"  (1.778 m), weight 230 lb (104.3 kg), SpO2 97 %. Body mass index is 33 kg/m.  General: Cooperative, alert, well developed, in no acute distress. HEENT: Conjunctivae and lids unremarkable. Cardiovascular: Regular rhythm.  Lungs: Normal work of breathing. Neurologic: No focal deficits.   Lab Results  Component Value Date   CREATININE 1.39 04/16/2022   BUN 14 04/16/2022   NA 139 04/16/2022   K 4.1 04/16/2022   CL 103 04/16/2022   CO2 30 04/16/2022   Lab Results  Component Value Date   ALT 28 04/16/2022   AST 17 04/16/2022   ALKPHOS 49 04/16/2022   BILITOT 0.7 04/16/2022   Lab Results  Component Value Date   HGBA1C 5.7 04/16/2022   HGBA1C 5.7 (H) 12/20/2021   HGBA1C 5.5 08/28/2017   Lab Results  Component Value Date   INSULIN 15.7 12/20/2021   Lab Results  Component Value Date   TSH 1.770 12/20/2021   Lab Results  Component Value  Date   CHOL 168 04/16/2022   HDL 26.70 (L) 04/16/2022   LDLCALC 134 (H) 12/20/2021   LDLDIRECT 102.0 04/16/2022   TRIG 370.0 (H) 04/16/2022   CHOLHDL 6 04/16/2022   Lab Results  Component Value Date   VD25OH 26.0 (L) 12/20/2021   VD25OH 28.75 (L) 08/28/2017   Lab Results  Component Value Date   WBC 7.7 04/16/2022   HGB 14.5 04/16/2022   HCT 43.2 04/16/2022   MCV 81.2 04/16/2022   PLT 178.0 04/16/2022   No results found for: "IRON", "TIBC",  "FERRITIN"  Attestation Statements:   Reviewed by clinician on day of visit: allergies, medications, problem list, medical history, surgical history, family history, social history, and previous encounter notes.  I, Fortino Sic, RMA am acting as transcriptionist for Reuben Likes, MD.  I have reviewed the above documentation for accuracy and completeness, and I agree with the above. - Reuben Likes, MD

## 2022-07-04 ENCOUNTER — Encounter (INDEPENDENT_AMBULATORY_CARE_PROVIDER_SITE_OTHER): Payer: Self-pay

## 2022-07-04 ENCOUNTER — Ambulatory Visit (INDEPENDENT_AMBULATORY_CARE_PROVIDER_SITE_OTHER): Payer: No Typology Code available for payment source | Admitting: Family Medicine

## 2022-07-04 ENCOUNTER — Encounter (INDEPENDENT_AMBULATORY_CARE_PROVIDER_SITE_OTHER): Payer: Self-pay | Admitting: Family Medicine

## 2022-07-04 ENCOUNTER — Telehealth (INDEPENDENT_AMBULATORY_CARE_PROVIDER_SITE_OTHER): Payer: No Typology Code available for payment source | Admitting: Family Medicine

## 2022-07-04 DIAGNOSIS — E669 Obesity, unspecified: Secondary | ICD-10-CM

## 2022-07-04 DIAGNOSIS — Z6833 Body mass index (BMI) 33.0-33.9, adult: Secondary | ICD-10-CM

## 2022-07-04 DIAGNOSIS — R7303 Prediabetes: Secondary | ICD-10-CM

## 2022-07-04 DIAGNOSIS — E559 Vitamin D deficiency, unspecified: Secondary | ICD-10-CM

## 2022-07-04 MED ORDER — VITAMIN D (ERGOCALCIFEROL) 1.25 MG (50000 UNIT) PO CAPS
50000.0000 [IU] | ORAL_CAPSULE | ORAL | 0 refills | Status: DC
Start: 2022-07-04 — End: 2022-09-12

## 2022-07-04 NOTE — Progress Notes (Signed)
TeleHealth Visit:  This visit was completed with telemedicine (audio/video) technology. Jerry Schmidt has verbally consented to this TeleHealth visit. The patient is located at home, the provider is located at home. The participants in this visit include the listed provider and patient. The visit was conducted today via MyChart video.  OBESITY Jerry Schmidt is here to discuss his progress with his obesity treatment plan along with follow-up of his obesity related diagnoses.   Today's visit was # 8 Starting weight: 251 lbs Starting date: 12/20/2021 Weight at last in office visit: 230 lbs on 05/30/22 Total weight loss: 21 lbs at last in office visit on 05/30/22. Today's reported weight: maintained  Nutrition Plan: keeping a food journal and adhering to recommended goals of 2000-28000 calories and 140 gms protein.   Current exercise:  workout at home with son- variety of exercises 3-4 days per week for 30 minutes.   Interim History: Hunger is well controlled when he eats 2300 calories.   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  Protein intake is low.  He has not been packing his lunch but plans on getting back to this once school is over in 2 weeks.   He journals some every day but generally mostly breakfast and lunch.  He then estimates how many calories he has left for dinner and tries to stay within that parameter.  His family eats out 6-7 evenings per week--Mexican or a steak house.  He tries to make healthy choices.  He has about 1 glass of sweet tea per day when he eats out. He plans on exercising more. His wife is also a patient at our clinic. His weight goal is 200 pounds (28 BMI).  Assessment/Plan:  1. Vitamin D Deficiency Vitamin D is not at goal of 50.  Vitamin D is low at 26 on 12/20/2021. He is on weekly prescription Vitamin D 50,000 IU.  Lab Results  Component Value Date   VD25OH 26.0 (L) 12/20/2021   VD25OH 28.75 (L) 08/28/2017     Plan: Refill prescription vitamin D 50,000 IU weekly.   2. Prediabetes Last A1c was elevated at 5.7 on 04/16/2022 but stable from lab draw on 12/20/2021 when it was also 5.7. Medication(s): none Denies polyphagia Lab Results  Component Value Date   HGBA1C 5.7 04/16/2022   Lab Results  Component Value Date   INSULIN 15.7 12/20/2021    Plan: Continue meal plan and regular exercise.   3. Obesity: Current BMI 33 Jerry Schmidt is currently in the action stage of change. As such, his goal is to continue with weight loss efforts.  He has agreed to keeping a food journal and adhering to recommended goals of 2000-2800  calories and 140 gms protein.   Exercise goals: He plans on trying to increase exercise.  Behavioral modification strategies: increasing lean protein intake, decreasing simple carbohydrates, decreasing liquid calories, and decreasing eating out.  Jerry Schmidt has agreed to follow-up with our clinic in 5 weeks.   No orders of the defined types were placed in this encounter.   There are no discontinued medications.   No orders of the defined types were placed in this encounter.     Objective:   VITALS: Per patient if applicable, see vitals. GENERAL: Alert and in no acute distress. CARDIOPULMONARY: No increased WOB. Speaking in clear sentences.  PSYCH: Pleasant and cooperative. Speech normal rate and rhythm. Affect is appropriate. Insight and judgement are appropriate. Attention is focused, linear, and appropriate.  NEURO: Oriented as arrived to appointment on time with no prompting.   Lab Results  Component Value Date   CREATININE 1.39 04/16/2022   BUN 14 04/16/2022   NA 139 04/16/2022   K 4.1 04/16/2022   CL 103 04/16/2022   CO2 30 04/16/2022   Lab Results  Component Value Date   ALT 28 04/16/2022   AST 17 04/16/2022   ALKPHOS 49 04/16/2022   BILITOT 0.7 04/16/2022   Lab Results  Component Value Date   HGBA1C 5.7 04/16/2022   HGBA1C 5.7 (H)  12/20/2021   HGBA1C 5.5 08/28/2017   Lab Results  Component Value Date   INSULIN 15.7 12/20/2021   Lab Results  Component Value Date   TSH 1.770 12/20/2021   Lab Results  Component Value Date   CHOL 168 04/16/2022   HDL 26.70 (L) 04/16/2022   LDLCALC 134 (H) 12/20/2021   LDLDIRECT 102.0 04/16/2022   TRIG 370.0 (H) 04/16/2022   CHOLHDL 6 04/16/2022   Lab Results  Component Value Date   WBC 7.7 04/16/2022   HGB 14.5 04/16/2022   HCT 43.2 04/16/2022   MCV 81.2 04/16/2022   PLT 178.0 04/16/2022   No results found for: "IRON", "TIBC", "FERRITIN" Lab Results  Component Value Date   VD25OH 26.0 (L) 12/20/2021   VD25OH 28.75 (L) 08/28/2017    Attestation Statements:   Reviewed by clinician on day of visit: allergies, medications, problem list, medical history, surgical history, family history, social history, and previous encounter notes.

## 2022-08-08 ENCOUNTER — Ambulatory Visit (INDEPENDENT_AMBULATORY_CARE_PROVIDER_SITE_OTHER): Payer: No Typology Code available for payment source | Admitting: Family Medicine

## 2022-08-15 ENCOUNTER — Telehealth: Payer: No Typology Code available for payment source | Admitting: Physician Assistant

## 2022-08-15 DIAGNOSIS — L237 Allergic contact dermatitis due to plants, except food: Secondary | ICD-10-CM | POA: Diagnosis not present

## 2022-08-15 MED ORDER — PREDNISONE 10 MG PO TABS: ORAL_TABLET | ORAL | 0 refills | Status: DC

## 2022-08-15 NOTE — Progress Notes (Signed)
Virtual Visit Consent   Jerry Schmidt, you are scheduled for a virtual visit with a  provider today. Just as with appointments in the office, your consent must be obtained to participate. Your consent will be active for this visit and any virtual visit you may have with one of our providers in the next 365 days. If you have a MyChart account, a copy of this consent can be sent to you electronically.  As this is a virtual visit, video technology does not allow for your provider to perform a traditional examination. This may limit your provider's ability to fully assess your condition. If your provider identifies any concerns that need to be evaluated in person or the need to arrange testing (such as labs, EKG, etc.), we will make arrangements to do so. Although advances in technology are sophisticated, we cannot ensure that it will always work on either your end or our end. If the connection with a video visit is poor, the visit may have to be switched to a telephone visit. With either a video or telephone visit, we are not always able to ensure that we have a secure connection.  By engaging in this virtual visit, you consent to the provision of healthcare and authorize for your insurance to be billed (if applicable) for the services provided during this visit. Depending on your insurance coverage, you may receive a charge related to this service.  I need to obtain your verbal consent now. Are you willing to proceed with your visit today? Jerry Schmidt has provided verbal consent on 08/15/2022 for a virtual visit (video or telephone). Margaretann Loveless, PA-C  Date: 08/15/2022 10:22 AM  Virtual Visit via Video Note   I, Margaretann Loveless, connected with  Jerry Schmidt  ([id number], 1981-05-21) on 08/15/22 at 10:15 AM EDT by a video-enabled telemedicine application and verified that I am speaking with the correct person using two identifiers.  Location: Patient: Virtual Visit  Location Patient: Mobile Provider: Virtual Visit Location Provider: Home Office   I discussed the limitations of evaluation and management by telemedicine and the availability of in person appointments. The patient expressed understanding and agreed to proceed.    History of Present Illness: Jerry Schmidt is a 41 y.o. who identifies as a male who was assigned male at birth, and is being seen today for possible poison ivy.  HPI: Poison [place] This is a new problem. The current episode started in the past 7 days (over the weekend). The problem has been gradually worsening since onset. The affected locations include the left arm, right arm, left lower leg, right lower leg, right ankle and left ankle. The rash is characterized by redness, itchiness and blistering. He was exposed to plant contact. Pertinent negatives include no congestion, fatigue, fever, shortness of breath or sore throat. Past treatments include anti-itch cream and cold compress (calamine lotion). The treatment provided no relief.      Problems:  Patient Active Problem List   Diagnosis Date Noted   Testosterone deficiency 04/16/2022   Exposure to COVID-19 virus 12/04/2019   Obesity (BMI 30.0-34.9) 06/01/2019    Allergies: No Known Allergies Medications:  Current Outpatient Medications:    predniSONE (DELTASONE) 10 MG tablet, Days 1-4 take 4 tablets (40 mg) daily  Days 5-8 take 3 tablets (30 mg) daily, Days 9-11 take 2 tablets (20 mg) daily, Days 12-14 take 1 tablet (10 mg) daily., Disp: 37 tablet, Rfl: 0   cetirizine (ZYRTEC) 10 MG  tablet, Take 10 mg by mouth daily., Disp: , Rfl:    SYRINGE-NEEDLE, DISP, 3 ML (LUER LOCK SAFETY SYRINGES) 22G X 1-1/2" 3 ML MISC, 1 Syringe by Does not apply route every 14 (fourteen) days., Disp: 10 each, Rfl: 3   testosterone cypionate (DEPO-TESTOSTERONE) 200 MG/ML injection, Inject 0.5 mLs (100 mg total) into the muscle once a week., Disp: 2 mL, Rfl: 5   Vitamin D, Ergocalciferol, (DRISDOL)  1.25 MG (50000 UNIT) CAPS capsule, Take 1 capsule (50,000 Units total) by mouth every 7 (seven) days., Disp: 4 capsule, Rfl: 0  Observations/Objective: Patient is well-developed, well-nourished in no acute distress.  Resting comfortably at home.  Head is normocephalic, atraumatic.  No labored breathing.  Speech is clear and coherent with logical content.  Patient is alert and oriented at baseline.    Assessment and Plan: 1. Poison ivy dermatitis - predniSONE (DELTASONE) 10 MG tablet; Days 1-4 take 4 tablets (40 mg) daily  Days 5-8 take 3 tablets (30 mg) daily, Days 9-11 take 2 tablets (20 mg) daily, Days 12-14 take 1 tablet (10 mg) daily.  Dispense: 37 tablet; Refill: 0  - Plant exposure (poison ivy/poison oak/poison sumac) with rash - Will prescribe Prednisone - May use topical Hydrocortisone cream, benadryl cream, and/or calamine lotion for itching - Cool compresses - Luke warm to cool showers - Seek in person evaluation if rash continues to spread or if any appear to become infected   Follow Up Instructions: I discussed the assessment and treatment plan with the patient. The patient was provided an opportunity to ask questions and all were answered. The patient agreed with the plan and demonstrated an understanding of the instructions.  A copy of instructions were sent to the patient via MyChart unless otherwise noted below.    The patient was advised to call back or seek an in-person evaluation if the symptoms worsen or if the condition fails to improve as anticipated.  Time:  I spent 11 minutes with the patient via telehealth technology discussing the above problems/concerns.    Mar Daring, PA-C

## 2022-08-15 NOTE — Patient Instructions (Signed)
Jerry Schmidt, thank you for joining Mar Daring, PA-C for today's virtual visit.  While this provider is not your primary care provider (PCP), if your PCP is located in our provider database this encounter information will be shared with them immediately following your visit.  Consent: (Patient) Jerry Schmidt provided verbal consent for this virtual visit at the beginning of the encounter.  Current Medications:  Current Outpatient Medications:    predniSONE (DELTASONE) 10 MG tablet, Days 1-4 take 4 tablets (40 mg) daily  Days 5-8 take 3 tablets (30 mg) daily, Days 9-11 take 2 tablets (20 mg) daily, Days 12-14 take 1 tablet (10 mg) daily., Disp: 37 tablet, Rfl: 0   cetirizine (ZYRTEC) 10 MG tablet, Take 10 mg by mouth daily., Disp: , Rfl:    SYRINGE-NEEDLE, DISP, 3 ML (LUER LOCK SAFETY SYRINGES) 22G X 1-1/2" 3 ML MISC, 1 Syringe by Does not apply route every 14 (fourteen) days., Disp: 10 each, Rfl: 3   testosterone cypionate (DEPO-TESTOSTERONE) 200 MG/ML injection, Inject 0.5 mLs (100 mg total) into the muscle once a week., Disp: 2 mL, Rfl: 5   Vitamin D, Ergocalciferol, (DRISDOL) 1.25 MG (50000 UNIT) CAPS capsule, Take 1 capsule (50,000 Units total) by mouth every 7 (seven) days., Disp: 4 capsule, Rfl: 0   Medications ordered in this encounter:  Meds ordered this encounter  Medications   predniSONE (DELTASONE) 10 MG tablet    Sig: Days 1-4 take 4 tablets (40 mg) daily  Days 5-8 take 3 tablets (30 mg) daily, Days 9-11 take 2 tablets (20 mg) daily, Days 12-14 take 1 tablet (10 mg) daily.    Dispense:  37 tablet    Refill:  0    Order Specific Question:   Supervising Provider    Answer:   Chase Picket [id number]     *If you need refills on other medications prior to your next appointment, please contact your pharmacy*  Follow-Up: Call back or seek an in-person evaluation if the symptoms worsen or if the condition fails to improve as anticipated.  Other  Instructions Poison Ivy Dermatitis Poison ivy dermatitis is inflammation of the skin that is caused by chemicals in the leaves of the poison ivy plant. The skin reaction often involves redness, swelling, blisters, and extreme itching. What are the causes? This condition is caused by a chemical (urushiol) found in the sap of the poison ivy plant. This chemical is sticky and can be easily spread to people, animals, and objects. You can get poison ivy dermatitis by: Having direct contact with a poison ivy plant. Touching animals, other people, or objects that have come in contact with poison ivy and have the chemical on them. What increases the risk? This condition is more likely to develop in people who: Are outdoors often in wooded or [place] areas. Go outdoors without wearing protective clothing, such as closed shoes, long pants, and a long-sleeved shirt. What are the signs or symptoms? Symptoms of this condition include: Redness of the skin. Extreme itching. A rash that often includes bumps and blisters. The rash usually appears 48 hours after exposure, if you have been exposed before. If this is the first time you have been exposed, the rash may not appear until a week after exposure. Swelling. This may occur if the reaction is more severe. Symptoms usually last for 1-2 weeks. However, the first time you develop this condition, symptoms may last 3-4 weeks. How is this diagnosed? This condition may be diagnosed based  on your symptoms and a physical exam. Your health care provider may also ask you about any recent outdoor activity. How is this treated? Treatment for this condition will vary depending on how severe it is. Treatment may include: Hydrocortisone cream or calamine lotion to relieve itching. Oatmeal baths to soothe the skin. Medicines, such as over-the-counter antihistamine tablets. Oral steroid medicine, for more severe reactions. Follow these instructions at  home: Medicines Take or apply over-the-counter and prescription medicines only as told by your health care provider. Use hydrocortisone cream or calamine lotion as needed to soothe the skin and relieve itching. General instructions Do not scratch or rub your skin. Apply a cold, wet cloth (cold compress) to the affected areas or take baths in cool water. This will help with itching. Avoid hot baths and showers. Take oatmeal baths as needed. Use colloidal oatmeal. You can get this at your local pharmacy or grocery store. Follow the instructions on the packaging. While you have the rash, wash clothes right after you wear them. Keep all follow-up visits as told by your health care provider. This is important. How is this prevented?  Learn to identify the poison ivy plant and avoid contact with the plant. This plant can be recognized by the number of leaves. Generally, poison ivy has three leaves with flowering branches on a single stem. The leaves are typically glossy, and they have jagged edges that come to a point at the front. If you have been exposed to poison ivy, thoroughly wash with soap and water right away. You have about 30 minutes to remove the plant resin before it will cause the rash. Be sure to wash under your fingernails, because any plant resin there will continue to spread the rash. When hiking or camping, wear clothes that will help you to avoid exposure on the skin. This includes long pants, a long-sleeved shirt, tall socks, and hiking boots. You can also apply preventive lotion to your skin to help limit exposure. If you suspect that your clothes or outdoor gear came in contact with poison ivy, rinse them off outside with a garden hose before you bring them inside your house. When doing yard work or gardening, wear gloves, long sleeves, long pants, and boots. Wash your garden tools and gloves if they come in contact with poison ivy. If you suspect that your pet has come into contact  with poison ivy, wash him or her with pet shampoo and water. Make sure to wear gloves while washing your pet. Contact a health care provider if you have: Open sores in the rash area. More redness, swelling, or pain in the affected area. Redness that spreads beyond the rash area. Fluid, blood, or pus coming from the affected area. A fever. A rash over a large area of your body. A rash on your eyes, mouth, or genitals. A rash that does not improve after a few weeks. Get help right away if: Your face swells or your eyes swell shut. You have trouble breathing. You have trouble swallowing. These symptoms may represent a serious problem that is an emergency. Do not wait to see if the symptoms will go away. Get medical help right away. Call your local emergency services (911 in the [place] Do not drive yourself to the hospital. Summary Poison ivy dermatitis is inflammation of the skin that is caused by chemicals in the leaves of the poison ivy plant. Symptoms of this condition include redness, itching, a rash, and swelling. Do not scratch or rub  your skin. Take or apply over-the-counter and prescription medicines only as told by your health care provider. This information is not intended to replace advice given to you by your health care provider. Make sure you discuss any questions you have with your health care provider. Document Revised: 08/28/2021 Document Reviewed: 08/28/2021 Elsevier Patient Education  [place].    If you have been instructed to have an in-person evaluation today at a local Urgent Care facility, please use the link below. It will take you to a list of all of our available [facility], including address, phone number and hours of operation. Please do not delay care.  [facility]  If you or a family member do not have a primary care provider, use the link below to schedule a visit and establish care. When you choose a [facility] primary  care physician or advanced practice provider, you gain a long-term partner in health. Find a Primary Care Provider  Learn more about 's in-office and virtual care options: [facility]

## 2022-08-20 ENCOUNTER — Encounter: Payer: Self-pay | Admitting: *Deleted

## 2022-09-12 ENCOUNTER — Encounter (INDEPENDENT_AMBULATORY_CARE_PROVIDER_SITE_OTHER): Payer: Self-pay | Admitting: Family Medicine

## 2022-09-12 ENCOUNTER — Ambulatory Visit (INDEPENDENT_AMBULATORY_CARE_PROVIDER_SITE_OTHER): Payer: No Typology Code available for payment source | Admitting: Family Medicine

## 2022-09-12 VITALS — BP 122/76 | HR 90 | Temp 98.4°F | Ht 70.0 in | Wt 226.0 lb

## 2022-09-12 DIAGNOSIS — E669 Obesity, unspecified: Secondary | ICD-10-CM

## 2022-09-12 DIAGNOSIS — E559 Vitamin D deficiency, unspecified: Secondary | ICD-10-CM

## 2022-09-12 DIAGNOSIS — Z72820 Sleep deprivation: Secondary | ICD-10-CM | POA: Diagnosis not present

## 2022-09-12 DIAGNOSIS — Z6832 Body mass index (BMI) 32.0-32.9, adult: Secondary | ICD-10-CM

## 2022-09-12 MED ORDER — VITAMIN D (ERGOCALCIFEROL) 1.25 MG (50000 UNIT) PO CAPS: 50000.0000 [IU] | ORAL_CAPSULE | ORAL | 0 refills | Status: DC

## 2022-09-18 NOTE — Progress Notes (Signed)
Chief Complaint:   OBESITY Jerry Schmidt is here to discuss his progress with his obesity treatment plan along with follow-up of his obesity related diagnoses. Jerry Schmidt is on keeping a food journal and adhering to recommended goals of 2000-2800 calories and 140 grams of protein and states he is following his eating plan approximately 85% of the time. [name] states he is is doing push ups and sit ups 3 times per week.  Today's visit was #: 8 Starting weight: 251 lbs Starting date: 12/20/2021 Today's weight: 226 lbs Today's date: 09/12/2022 Total lbs lost to date: 25 lbs Total lbs lost since last in-office visit: 3  Interim History: [name] has done well with weight loss. Doing well meeting protein goals/calorie goals--might not always be meeting calorie goal.  Subjective:   1. Vitamin D deficiency [name] is currently taking prescription Vit D 50,000 IU once a week. Denies any side effects.  2. 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  Assessment/Plan:   1. Vitamin D deficiency We will refill Vit D 50k IU once a week for 1 month with 0 refills.  -Refill Vitamin D, Ergocalciferol, (DRISDOL) 1.25 MG (50000 UNIT) CAPS capsule; Take 1 capsule (50,000 Units total) by mouth every 7 (seven) days.  Dispense: 4 capsule; Refill: 0  2. Poor sleep Discussed strategies to help with sleeping patterns.  3. Obesity with current BMI of 32.5 Jerry Schmidt is currently in the action stage of change. As such, his goal is to continue with weight loss efforts. He has agreed to keeping a food journal and adhering to recommended goals of 2300-2800 calories and 140+ grams of protein daily.   Exercise goals: All adults should avoid inactivity. Some physical activity is better than none, and adults who participate in any amount of physical activity gain some health benefits.  Encouraged to bring in journal  at next visit.  Behavioral modification strategies: increasing lean protein intake, decreasing simple carbohydrates, no skipping meals, and planning for success.  Jerry Schmidt has agreed to follow-up with our clinic in 4 weeks. He was informed of the importance of frequent follow-up visits to maximize his success with intensive lifestyle modifications for his multiple health conditions.   Objective:   Blood pressure 122/76, pulse 90, temperature 98.4 F (36.9 C), height 5\' 10"  (1.778 m), weight 226 lb (102.5 kg), SpO2 98 %. Body mass index is 32.43 kg/m.  General: Cooperative, alert, well developed, in no acute distress. HEENT: Conjunctivae and lids unremarkable. Cardiovascular: Regular rhythm.  Lungs: Normal work of breathing. Neurologic: No focal deficits.   Lab Results  Component Value Date   CREATININE 1.39 04/16/2022   BUN 14 04/16/2022   NA 139 04/16/2022   K 4.1 04/16/2022   CL 103 04/16/2022   CO2 30 04/16/2022   Lab Results  Component Value Date   ALT 28 04/16/2022   AST 17 04/16/2022   ALKPHOS 49 04/16/2022   BILITOT 0.7 04/16/2022   Lab Results  Component Value Date   HGBA1C 5.7 04/16/2022   HGBA1C 5.7 (H) 12/20/2021   HGBA1C 5.5 08/28/2017   Lab Results  Component Value Date   INSULIN 15.7 12/20/2021   Lab Results  Component Value Date   TSH 1.770 12/20/2021   Lab Results  Component Value Date   CHOL 168 04/16/2022   HDL 26.70 (L) 04/16/2022   LDLCALC 134 (H) 12/20/2021   LDLDIRECT 102.0 04/16/2022   TRIG 370.0 (H) 04/16/2022  CHOLHDL 6 04/16/2022   Lab Results  Component Value Date   VD25OH 26.0 (L) 12/20/2021   VD25OH 28.75 (L) 08/28/2017   Lab Results  Component Value Date   WBC 7.7 04/16/2022   HGB 14.5 04/16/2022   HCT 43.2 04/16/2022   MCV 81.2 04/16/2022   PLT 178.0 04/16/2022   No results found for: "IRON", "TIBC", "FERRITIN"  Attestation Statements:   Reviewed by clinician on day of visit: allergies, medications, problem  list, medical history, surgical history, family history, social history, and previous encounter notes.  I, Elnora Morrison, RMA am acting as transcriptionist for Coralie Common, MD.  I have reviewed the above documentation for accuracy and completeness, and I agree with the above. - Coralie Common, MD

## 2022-10-11 ENCOUNTER — Ambulatory Visit (INDEPENDENT_AMBULATORY_CARE_PROVIDER_SITE_OTHER): Payer: No Typology Code available for payment source | Admitting: Family Medicine

## 2022-10-15 ENCOUNTER — Telehealth: Payer: No Typology Code available for payment source | Admitting: Family Medicine

## 2022-10-15 DIAGNOSIS — B9689 Other specified bacterial agents as the cause of diseases classified elsewhere: Secondary | ICD-10-CM | POA: Diagnosis not present

## 2022-10-15 DIAGNOSIS — J019 Acute sinusitis, unspecified: Secondary | ICD-10-CM | POA: Diagnosis not present

## 2022-10-15 MED ORDER — AZITHROMYCIN 250 MG PO TABS: ORAL_TABLET | ORAL | 0 refills | Status: AC

## 2022-10-15 NOTE — Patient Instructions (Signed)
Jerry Schmidt, thank you for joining Freddy Finner, NP for today's virtual visit.  While this provider is not your primary care provider (PCP), if your PCP is located in our provider database this encounter information will be shared with them immediately following your visit.   A [facility] MyChart account gives you access to today's visit and all your visits, tests, and labs performed at [facility] " click here if you don't have a [facility] MyChart account or go to [email or web address]  Consent: (Patient) Jerry Schmidt provided verbal consent for this virtual visit at the beginning of the encounter.  Current Medications:  Current Outpatient Medications:    azithromycin (ZITHROMAX) 250 MG tablet, Take 2 tablets on day 1, then 1 tablet daily on days 2 through 5, Disp: 6 tablet, Rfl: 0   cetirizine (ZYRTEC) 10 MG tablet, Take 10 mg by mouth daily., Disp: , Rfl:    predniSONE (DELTASONE) 10 MG tablet, Days 1-4 take 4 tablets (40 mg) daily  Days 5-8 take 3 tablets (30 mg) daily, Days 9-11 take 2 tablets (20 mg) daily, Days 12-14 take 1 tablet (10 mg) daily., Disp: 37 tablet, Rfl: 0   SYRINGE-NEEDLE, DISP, 3 ML (LUER LOCK SAFETY SYRINGES) 22G X 1-1/2" 3 ML MISC, 1 Syringe by Does not apply route every 14 (fourteen) days., Disp: 10 each, Rfl: 3   testosterone cypionate (DEPO-TESTOSTERONE) 200 MG/ML injection, Inject 0.5 mLs (100 mg total) into the muscle once a week., Disp: 2 mL, Rfl: 5   Vitamin D, Ergocalciferol, (DRISDOL) 1.25 MG (50000 UNIT) CAPS capsule, Take 1 capsule (50,000 Units total) by mouth every 7 (seven) days., Disp: 4 capsule, Rfl: 0   Medications ordered in this encounter:  Meds ordered this encounter  Medications   azithromycin (ZITHROMAX) 250 MG tablet    Sig: Take 2 tablets on day 1, then 1 tablet daily on days 2 through 5    Dispense:  6 tablet    Refill:  0    Order Specific Question:   Supervising Provider    Answer:   Merrilee Jansky  [[id number]]     *If you need refills on other medications prior to your next appointment, please contact your pharmacy*  Follow-Up: Call back or seek an in-person evaluation if the symptoms worsen or if the condition fails to improve as anticipated.  [facility] [phone]  Other Instructions -Take meds as prescribed -Rest -Use a cool mist humidifier especially during the winter months when heat dries out the air. - Use saline nose sprays frequently to help soothe nasal passages and promote drainage. -Saline irrigations of the nose can be very helpful if done frequently.             * 4X daily for 1 week*             * Use of a nettie pot can be helpful with this.  *Follow directions with this* *Boiled or distilled water only -stay hydrated by drinking plenty of fluids - Keep thermostat turn down low to prevent drying out sinuses - For any cough or congestion- robitussin DM or Delsym as needed - For fever or aches or pains- take tylenol or ibuprofen as directed on bottle             * for fevers greater than 101 orally you may alternate ibuprofen and tylenol every 3 hours.  If you do not improve you will need a follow up visit in  person.                   If you have been instructed to have an in-person evaluation today at a local Urgent Care facility, please use the link below. It will take you to a list of all of our available [facility], including address, phone number and hours of operation. Please do not delay care.  [facility]  If you or a family member do not have a primary care provider, use the link below to schedule a visit and establish care. When you choose a [facility] primary care physician or advanced practice provider, you gain a long-term partner in health. Find a Primary Care Provider  Learn more about [facility]'s in-office and virtual care options: [facility] - Get Care Now

## 2022-10-15 NOTE — Progress Notes (Signed)
Virtual Visit Consent   Jerry Schmidt, you are scheduled for a virtual visit with a [facility] provider today. Just as with appointments in the office, your consent must be obtained to participate. Your consent will be active for this visit and any virtual visit you may have with one of our providers in the next 365 days. If you have a MyChart account, a copy of this consent can be sent to you electronically.  As this is a virtual visit, video technology does not allow for your provider to perform a traditional examination. This may limit your provider's ability to fully assess your condition. If your provider identifies any concerns that need to be evaluated in person or the need to arrange testing (such as labs, EKG, etc.), we will make arrangements to do so. Although advances in technology are sophisticated, we cannot ensure that it will always work on either your end or our end. If the connection with a video visit is poor, the visit may have to be switched to a telephone visit. With either a video or telephone visit, we are not always able to ensure that we have a secure connection.  By engaging in this virtual visit, you consent to the provision of healthcare and authorize for your insurance to be billed (if applicable) for the services provided during this visit. Depending on your insurance coverage, you may receive a charge related to this service.  I need to obtain your verbal consent now. Are you willing to proceed with your visit today? Jerry Schmidt has provided verbal consent on 10/15/2022 for a virtual visit (video or telephone). Freddy Finner, NP  Date: 10/15/2022 2:18 PM  Virtual Visit via Video Note   I, Freddy Finner, connected with  Jerry Schmidt  ([id number], 09/11/1981) on 10/15/22 at  2:15 PM EST by a video-enabled telemedicine application and verified that I am speaking with the correct person using two identifiers.  Location: Patient: Virtual Visit Location  Patient: Home Provider: Virtual Visit Location Provider: Home Office   I discussed the limitations of evaluation and management by telemedicine and the availability of in person appointments. The patient expressed understanding and agreed to proceed.    History of Present Illness: Jerry Schmidt is a 41 y.o. who identifies as a male who was assigned male at birth, and is being seen today for cough and congestion started on Friday. Reports cough with green mucus. Having sweating at night- but unsure of fevers. Denies chest pain (unless coughing) and SHOB.  History of allergies and sinus infections-and some coughing related to it.  Sudafed sinus OTC not helping. Does not like nasal sprays.    Problems:  Patient Active Problem List   Diagnosis Date Noted   Testosterone deficiency 04/16/2022   Exposure to COVID-19 virus 12/04/2019   Obesity (BMI 30.0-34.9) 06/01/2019    Allergies: No Known Allergies Medications:  Current Outpatient Medications:    cetirizine (ZYRTEC) 10 MG tablet, Take 10 mg by mouth daily., Disp: , Rfl:    predniSONE (DELTASONE) 10 MG tablet, Days 1-4 take 4 tablets (40 mg) daily  Days 5-8 take 3 tablets (30 mg) daily, Days 9-11 take 2 tablets (20 mg) daily, Days 12-14 take 1 tablet (10 mg) daily., Disp: 37 tablet, Rfl: 0   SYRINGE-NEEDLE, DISP, 3 ML (LUER LOCK SAFETY SYRINGES) 22G X 1-1/2" 3 ML MISC, 1 Syringe by Does not apply route every 14 (fourteen) days., Disp: 10 each, Rfl: 3   testosterone cypionate (  DEPO-TESTOSTERONE) 200 MG/ML injection, Inject 0.5 mLs (100 mg total) into the muscle once a week., Disp: 2 mL, Rfl: 5   Vitamin D, Ergocalciferol, (DRISDOL) 1.25 MG (50000 UNIT) CAPS capsule, Take 1 capsule (50,000 Units total) by mouth every 7 (seven) days., Disp: 4 capsule, Rfl: 0  Observations/Objective: Patient is well-developed, well-nourished in no acute distress.  Resting comfortably  at home.  Head is normocephalic, atraumatic.  No labored breathing.   Speech is clear and coherent with logical content.  Patient is alert and oriented at baseline.  Cough present Nasal tone  Assessment and Plan: 1. Acute bacterial sinusitis  - azithromycin (ZITHROMAX) 250 MG tablet; Take 2 tablets on day 1, then 1 tablet daily on days 2 through 5  Dispense: 6 tablet; Refill: 0  -Take meds as prescribed -Rest -Use a cool mist humidifier especially during the winter months when heat dries out the air. - Use saline nose sprays frequently to help soothe nasal passages and promote drainage. -Saline irrigations of the nose can be very helpful if done frequently.             * 4X daily for 1 week*             * Use of a nettie pot can be helpful with this.  *Follow directions with this* *Boiled or distilled water only -stay hydrated by drinking plenty of fluids - Keep thermostat turn down low to prevent drying out sinuses - For any cough or congestion- robitussin DM or Delsym as needed - For fever or aches or pains- take tylenol or ibuprofen as directed on bottle             * for fevers greater than 101 orally you may alternate ibuprofen and tylenol every 3 hours.  If you do not improve you will need a follow up visit in person.                 Past Medical, Surgical, Social History, Allergies, and Medications have been Reviewed.   Reviewed side effects, risks and benefits of medication.     Patient acknowledged agreement and understanding of the plan.     Follow Up Instructions: I discussed the assessment and treatment plan with the patient. The patient was provided an opportunity to ask questions and all were answered. The patient agreed with the plan and demonstrated an understanding of the instructions.  A copy of instructions were sent to the patient via MyChart unless otherwise noted below.     The patient was advised to call back or seek an in-person evaluation if the symptoms worsen or if the condition fails to improve as  anticipated.  Time:  I spent 6 minutes with the patient via telehealth technology discussing the above problems/concerns.    Freddy Finner, NP

## 2022-10-16 ENCOUNTER — Encounter (INDEPENDENT_AMBULATORY_CARE_PROVIDER_SITE_OTHER): Payer: Self-pay | Admitting: Family Medicine

## 2022-10-16 ENCOUNTER — Telehealth (INDEPENDENT_AMBULATORY_CARE_PROVIDER_SITE_OTHER): Payer: No Typology Code available for payment source | Admitting: Family Medicine

## 2022-10-16 DIAGNOSIS — E559 Vitamin D deficiency, unspecified: Secondary | ICD-10-CM | POA: Diagnosis not present

## 2022-10-16 DIAGNOSIS — E669 Obesity, unspecified: Secondary | ICD-10-CM

## 2022-10-16 MED ORDER — VITAMIN D (ERGOCALCIFEROL) 1.25 MG (50000 UNIT) PO CAPS
50000.0000 [IU] | ORAL_CAPSULE | ORAL | 0 refills | Status: DC
Start: 2022-10-16 — End: 2022-11-21

## 2022-10-16 NOTE — Progress Notes (Addendum)
TeleHealth Visit:  This visit was completed with telemedicine (audio/video) technology. Jerry Schmidt has verbally consented to this TeleHealth visit. The patient is located at home, the provider is located at home. The participants in this visit include the listed provider and patient. The visit was conducted today via MyChart video.  OBESITY Jerry Schmidt is here to discuss his progress with his obesity treatment plan along with follow-up of his obesity related diagnoses.   Today's visit was # 9 Starting weight: 251 lbs Starting date: 12/20/2021 Weight at last in office visit: 226 lbs on 09/12/22 Total weight loss: 25 lbs at last in office visit on 09/12/22. Today's reported weight:  No weight reported.  Nutrition Plan: keeping a food journal and adhering to recommended goals of 2000-2800 calories and 140 grams of protein.   Current exercise:  doing push ups and sit ups 3 times per week.  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  Interim History: He had 2 change his in office visit today to a virtual visit due to a work conflict. He has not been journaling consistently-having issues with [employer] wanting him to upgrade.  However he is paying attention to protein intake.  He tries to have 1 g of protein for every pound of body weight (225 g/day).  His family is eating at home more often.  He is avoiding most carbohydrates.  He sometimes eats at [employer] during the course of his work shift but eats from the [employer] No sugar sweetened beverage intake. Denies polyphagia.  Assessment/Plan:  1. Vitamin D Deficiency Vitamin D is not at goal of 50.  Last vitamin D level was low at 26. He is on weekly prescription Vitamin D 50,000 IU.  Lab Results  Component Value Date   VD25OH 26.0 (L) 12/20/2021   VD25OH 28.75 (L) 08/28/2017    Plan: Refill prescription vitamin D 50,000 IU weekly. Check vitamin D level next office visit.   2. Obesity: Current BMI  32.5 Jerry Schmidt is currently in the action stage of change. As such, his goal is to continue with weight loss efforts.  He has agreed to keeping a food journal and adhering to recommended goals of 2000-2800 calories and 140 grams of protein. Marland Kitchen  He will try tracking using the Lose It! app. Holiday tips handout sent via MyChart.  Exercise goals: Continue current exercise regimen.  Encouraged him to work toward a goal of 150 minutes of cardio per week.  Behavioral modification strategies: decreasing simple carbohydrates, decreasing eating out, and keeping a strict food journal.  Jerry Schmidt has agreed to follow-up with our clinic in 4 weeks with fasting labs..   No orders of the defined types were placed in this encounter.   Medications Discontinued During This Encounter  Medication Reason   Vitamin D, Ergocalciferol, (DRISDOL) 1.25 MG (50000 UNIT) CAPS capsule Reorder     Meds ordered this encounter  Medications   Vitamin D, Ergocalciferol, (DRISDOL) 1.25 MG (50000 UNIT) CAPS capsule    Sig: Take 1 capsule (50,000 Units total) by mouth every 7 (seven) days.    Dispense:  4 capsule    Refill:  0    Order Specific Question:   Supervising Provider    Answer:   Glennis Brink [[id number]]      Objective:   VITALS: Per patient if applicable, see vitals. GENERAL: Alert and in no acute distress. CARDIOPULMONARY: No increased WOB. Speaking in clear sentences.  PSYCH: Pleasant and cooperative. Speech normal rate  and rhythm. Affect is appropriate. Insight and judgement are appropriate. Attention is focused, linear, and appropriate.  NEURO: Oriented as arrived to appointment on time with no prompting.   Lab Results  Component Value Date   CREATININE 1.39 04/16/2022   BUN 14 04/16/2022   NA 139 04/16/2022   K 4.1 04/16/2022   CL 103 04/16/2022   CO2 30 04/16/2022   Lab Results  Component Value Date   ALT 28 04/16/2022   AST 17 04/16/2022   ALKPHOS 49 04/16/2022   BILITOT 0.7  04/16/2022   Lab Results  Component Value Date   HGBA1C 5.7 04/16/2022   HGBA1C 5.7 (H) 12/20/2021   HGBA1C 5.5 08/28/2017   Lab Results  Component Value Date   INSULIN 15.7 12/20/2021   Lab Results  Component Value Date   TSH 1.770 12/20/2021   Lab Results  Component Value Date   CHOL 168 04/16/2022   HDL 26.70 (L) 04/16/2022   LDLCALC 134 (H) 12/20/2021   LDLDIRECT 102.0 04/16/2022   TRIG 370.0 (H) 04/16/2022   CHOLHDL 6 04/16/2022   Lab Results  Component Value Date   WBC 7.7 04/16/2022   HGB 14.5 04/16/2022   HCT 43.2 04/16/2022   MCV 81.2 04/16/2022   PLT 178.0 04/16/2022   No results found for: "IRON", "TIBC", "FERRITIN" Lab Results  Component Value Date   VD25OH 26.0 (L) 12/20/2021   VD25OH 28.75 (L) 08/28/2017    Attestation Statements:   Reviewed by clinician on day of visit: allergies, medications, problem list, medical history, surgical history, family history, social history, and previous encounter notes.  Time spent on visit including the items listed below was 31 minutes.  -preparing to see the patient (e.g., review of tests, history, previous notes) -obtaining and/or reviewing separately obtained history -counseling and educating the patient/family/caregiver -documenting clinical information in the electronic or other health record -ordering medications, tests, or procedures

## 2022-11-08 ENCOUNTER — Encounter: Payer: Self-pay | Admitting: *Deleted

## 2022-11-13 ENCOUNTER — Ambulatory Visit (INDEPENDENT_AMBULATORY_CARE_PROVIDER_SITE_OTHER): Payer: No Typology Code available for payment source | Admitting: Family Medicine

## 2022-11-21 ENCOUNTER — Ambulatory Visit (INDEPENDENT_AMBULATORY_CARE_PROVIDER_SITE_OTHER): Payer: No Typology Code available for payment source | Admitting: Physician Assistant

## 2022-11-21 ENCOUNTER — Encounter (INDEPENDENT_AMBULATORY_CARE_PROVIDER_SITE_OTHER): Payer: Self-pay | Admitting: Physician Assistant

## 2022-11-21 VITALS — BP 115/77 | HR 90 | Temp 97.6°F | Ht 70.0 in | Wt 223.0 lb

## 2022-11-21 DIAGNOSIS — E785 Hyperlipidemia, unspecified: Secondary | ICD-10-CM

## 2022-11-21 DIAGNOSIS — R7303 Prediabetes: Secondary | ICD-10-CM | POA: Diagnosis not present

## 2022-11-21 DIAGNOSIS — E559 Vitamin D deficiency, unspecified: Secondary | ICD-10-CM | POA: Diagnosis not present

## 2022-11-21 DIAGNOSIS — M109 Gout, unspecified: Secondary | ICD-10-CM

## 2022-11-21 DIAGNOSIS — Z6836 Body mass index (BMI) 36.0-36.9, adult: Secondary | ICD-10-CM

## 2022-11-21 DIAGNOSIS — E669 Obesity, unspecified: Secondary | ICD-10-CM

## 2022-11-21 DIAGNOSIS — Z6832 Body mass index (BMI) 32.0-32.9, adult: Secondary | ICD-10-CM

## 2022-11-21 MED ORDER — VITAMIN D (ERGOCALCIFEROL) 1.25 MG (50000 UNIT) PO CAPS: 50000.0000 [IU] | ORAL_CAPSULE | ORAL | 0 refills | Status: DC

## 2022-11-21 MED ORDER — COLCHICINE 0.6 MG PO TABS: 0.6000 mg | ORAL_TABLET | Freq: Two times a day (BID) | ORAL | 1 refills | Status: DC

## 2022-11-22 LAB — LIPID PANEL WITH LDL/HDL RATIO
Cholesterol, Total: 227 mg/dL — ABNORMAL HIGH (ref 100–199)
HDL: 46 mg/dL (ref >39)
LDL Chol Calc (NIH): 154 mg/dL — ABNORMAL HIGH (ref 0–99)
LDL/HDL Ratio: 3.3 ratio (ref 0.0–3.6)
Triglycerides: 149 mg/dL (ref 0–149)
VLDL Cholesterol Cal: 27 mg/dL (ref 5–40)

## 2022-11-22 LAB — CMP14+EGFR
ALT: 28 IU/L (ref 0–44)
AST: 19 IU/L (ref 0–40)
Albumin/Globulin Ratio: 2.1 (ref 1.2–2.2)
Albumin: 5.1 g/dL (ref 4.1–5.1)
Alkaline Phosphatase: 67 IU/L (ref 44–121)
BUN/Creatinine Ratio: 16 (ref 9–20)
BUN: 18 mg/dL (ref 6–20)
Bilirubin Total: 1.1 mg/dL (ref 0.0–1.2)
CO2: 24 mmol/L (ref 20–29)
Calcium: 10 mg/dL (ref 8.7–10.2)
Chloride: 99 mmol/L (ref 96–106)
Creatinine, Ser: 1.11 mg/dL (ref 0.76–1.27)
Globulin, Total: 2.4 g/dL (ref 1.5–4.5)
Glucose: 83 mg/dL (ref 70–99)
Potassium: 4.4 mmol/L (ref 3.5–5.2)
Sodium: 139 mmol/L (ref 134–144)
Total Protein: 7.5 g/dL (ref 6.0–8.5)
eGFR: 87 mL/min/{1.73_m2} (ref >59)

## 2022-11-22 LAB — INSULIN, RANDOM: INSULIN: 5.6 u[IU]/mL (ref 2.6–24.9)

## 2022-11-22 LAB — URIC ACID: Uric Acid: 7.6 mg/dL (ref 3.8–8.4)

## 2022-11-22 LAB — HEMOGLOBIN A1C
Est. average glucose Bld gHb Est-mCnc: 117 mg/dL
Hgb A1c MFr Bld: 5.7 % — ABNORMAL HIGH (ref 4.8–5.6)

## 2022-11-22 LAB — VITAMIN D 25 HYDROXY (VIT D DEFICIENCY, FRACTURES): Vit D, 25-Hydroxy: 41.1 ng/mL (ref 30.0–100.0)

## 2022-12-05 NOTE — Progress Notes (Signed)
Chief Complaint:   OBESITY Jerry Schmidt is here to discuss his progress with his obesity treatment plan along with follow-up of his obesity related diagnoses. Jerry Schmidt is on keeping a food journal and adhering to recommended goals of 2300-2800 calories and 140+ grams of protein and states he is following his eating plan approximately 45% of the time. Jerry Schmidt states he is doing push ups/sit up 150 each 10-15 minutes 3 times per week.  Today's visit was #: 10 Starting weight: 251 lbs Starting date: 12/20/2021 Today's weight: 223 lbs Today's date: 11/21/2022 Total lbs lost to date: 28 lbs Total lbs lost since last in-office visit: 3  Interim History: Jerry Schmidt has been struggling with pain, redness in his right great toe. Feels he has and is concerned that it is related to his very high protein intake, up to 200 grams daily, at times. He has decreased his intake back to the nutrition plan goal of 140 grams daily.  We discussed some low purine choices and what foods he should avoid for now such as wild game (He normally hunts and eats venison regularly), anchovies and sardines, and any organ meats, as well as avoiding foods high in saturated fats like red meats, high fat dairy and skin from meats.    Subjective:   1. Prediabetes Previous A1c was 5.7. Working on prescribed nutrition plan to decrease simple carbs, increase lean protein and exercise to promote weight loss.  2. Vitamin D deficiency Jerry Schmidt is taking ergocalciferol once weekly.Last vitamin D level 26.0 on 12/20/2021. Denies any side effects.  3. Dyslipidemia Significant increase in triglycerides/LDL in past. Not on statin or other medications. Continue prescribed nutrition plan to decrease simple carbs, decrease saturated fat and increase lean proteins and exercise to promote weight loss.   4. Acute gout involving toe of right foot, unspecified cause Jerry Schmidt reports swelling, redness, pain in rt great toe.  May be related to very high protein diet. Discussed strategies to decrease purines as noted above in diet and plans to follow up with PCP.  Assessment/Plan:   1. Prediabetes We will obtain labs today. Continue prescribed nutrition plan to decrease simple carbs, decrease saturated fat and increase lean proteins and exercise to promote weight loss.   - CMP14+EGFR - Hemoglobin A1c - Insulin, random  2. Vitamin D deficiency We will obtain labs today. We will refill Vit D 50K IU once a week for 1 month with 0 refills.  -Refill Vitamin D, Ergocalciferol, (DRISDOL) 1.25 MG (50000 UNIT) CAPS capsule; Take 1 capsule (50,000 Units total) by mouth every 7 (seven) days.  Dispense: 4 capsule; Refill: 0  - VITAMIN D 25 Hydroxy (Vit-D Deficiency, Fractures)  3. Dyslipidemia We will obtain labs today.  May need to start statin and prescribed nutrition plan to decrease simple carbs, decrease saturated fat and increase lean proteins and exercise to promote weight loss.   - Lipid Panel With LDL/HDL Ratio  4. Acute gout involving toe of right foot, unspecified cause We will obtain labs today. Ordered colchicine 0.6 mg twice a day for 3 days with 1 refill for acute symptoms. Advised he should follow up with his PCP as well.   -Refill colchicine 0.6 MG tablet; Take 1 tablet (0.6 mg total) by mouth 2 (two) times daily.  Dispense: 10 tablet; Refill: 1  - Uric acid  5. Obesity with current BMI of 32.1 Jerry Schmidt is currently in the action stage of change. As such, his goal is to continue with weight loss efforts.  He has agreed to keeping a food journal and adhering to recommended goals of 2300-2800 calories and 140+ grams of protein daily.   Discussed following low purine diet and continue journaling 2300-2800 calories and 140 grams of protein daily.   Exercise goals: As is.  Behavioral modification strategies: increasing lean protein intake, decreasing simple carbohydrates, and keeping a strict food  journal.  Jerry Schmidt has agreed to follow-up with our clinic in 4 weeks. He was informed of the importance of frequent follow-up visits to maximize his success with intensive lifestyle modifications for his multiple health conditions.   Jerry Schmidt was informed we would discuss his lab results at his next visit unless there is a critical issue that needs to be addressed sooner. Jerry Schmidt agreed to keep his next visit at the agreed upon time to discuss these results.  Objective:   Blood pressure 115/77, pulse 90, temperature 97.6 F (36.4 C), height 5\' 10"  (1.778 m), weight 223 lb (101.2 kg), SpO2 97 %. Body mass index is 32 kg/m.  General: Cooperative, alert, well developed, in no acute distress. HEENT: Conjunctivae and lids unremarkable. Cardiovascular: Regular rhythm.  Lungs: Normal work of breathing. Neurologic: No focal deficits.   Lab Results  Component Value Date   CREATININE 1.11 11/21/2022   BUN 18 11/21/2022   NA 139 11/21/2022   K 4.4 11/21/2022   CL 99 11/21/2022   CO2 24 11/21/2022   Lab Results  Component Value Date   ALT 28 11/21/2022   AST 19 11/21/2022   ALKPHOS 67 11/21/2022   BILITOT 1.1 11/21/2022   Lab Results  Component Value Date   HGBA1C 5.7 (H) 11/21/2022   HGBA1C 5.7 04/16/2022   HGBA1C 5.7 (H) 12/20/2021   HGBA1C 5.5 08/28/2017   Lab Results  Component Value Date   INSULIN 5.6 11/21/2022   INSULIN 15.7 12/20/2021   Lab Results  Component Value Date   TSH 1.770 12/20/2021   Lab Results  Component Value Date   CHOL 227 (H) 11/21/2022   HDL 46 11/21/2022   LDLCALC 154 (H) 11/21/2022   LDLDIRECT 102.0 04/16/2022   TRIG 149 11/21/2022   CHOLHDL 6 04/16/2022   Lab Results  Component Value Date   VD25OH 41.1 11/21/2022   VD25OH 26.0 (L) 12/20/2021   VD25OH 28.75 (L) 08/28/2017   Lab Results  Component Value Date   WBC 7.7 04/16/2022   HGB 14.5 04/16/2022   HCT 43.2 04/16/2022   MCV 81.2 04/16/2022   PLT 178.0 04/16/2022    No results found for: "IRON", "TIBC", "FERRITIN"  Attestation Statements:   Reviewed by clinician on day of visit: allergies, medications, problem list, medical history, surgical history, family history, social history, and previous encounter notes.  I, Brendell Tyus, am acting as transcriptionist for [employer].  I have reviewed the above documentation for accuracy and completeness, and I agree with the above. -  Eldena Dede,PA-C

## 2022-12-16 ENCOUNTER — Other Ambulatory Visit (INDEPENDENT_AMBULATORY_CARE_PROVIDER_SITE_OTHER): Payer: Self-pay | Admitting: Physician Assistant

## 2022-12-16 DIAGNOSIS — M109 Gout, unspecified: Secondary | ICD-10-CM

## 2022-12-18 ENCOUNTER — Encounter (INDEPENDENT_AMBULATORY_CARE_PROVIDER_SITE_OTHER): Payer: Self-pay

## 2022-12-18 ENCOUNTER — Ambulatory Visit (INDEPENDENT_AMBULATORY_CARE_PROVIDER_SITE_OTHER): Payer: No Typology Code available for payment source | Admitting: Physician Assistant

## 2022-12-20 ENCOUNTER — Ambulatory Visit (INDEPENDENT_AMBULATORY_CARE_PROVIDER_SITE_OTHER): Payer: No Typology Code available for payment source | Admitting: Physician Assistant

## 2022-12-20 ENCOUNTER — Encounter (INDEPENDENT_AMBULATORY_CARE_PROVIDER_SITE_OTHER): Payer: Self-pay | Admitting: Physician Assistant

## 2022-12-20 VITALS — BP 118/78 | HR 61 | Temp 98.0°F | Ht 70.0 in | Wt 225.0 lb

## 2022-12-20 DIAGNOSIS — E559 Vitamin D deficiency, unspecified: Secondary | ICD-10-CM | POA: Diagnosis not present

## 2022-12-20 DIAGNOSIS — M109 Gout, unspecified: Secondary | ICD-10-CM

## 2022-12-20 DIAGNOSIS — E7849 Other hyperlipidemia: Secondary | ICD-10-CM | POA: Diagnosis not present

## 2022-12-20 DIAGNOSIS — R7303 Prediabetes: Secondary | ICD-10-CM | POA: Diagnosis not present

## 2022-12-20 DIAGNOSIS — E669 Obesity, unspecified: Secondary | ICD-10-CM

## 2022-12-20 DIAGNOSIS — Z6832 Body mass index (BMI) 32.0-32.9, adult: Secondary | ICD-10-CM

## 2022-12-20 DIAGNOSIS — Z6836 Body mass index (BMI) 36.0-36.9, adult: Secondary | ICD-10-CM

## 2022-12-20 MED ORDER — VITAMIN D (ERGOCALCIFEROL) 1.25 MG (50000 UNIT) PO CAPS: 50000.0000 [IU] | ORAL_CAPSULE | ORAL | 0 refills | Status: DC

## 2022-12-20 MED ORDER — COLCHICINE 0.6 MG PO TABS: 0.6000 mg | ORAL_TABLET | Freq: Two times a day (BID) | ORAL | 1 refills | Status: DC

## 2022-12-24 ENCOUNTER — Telehealth (INDEPENDENT_AMBULATORY_CARE_PROVIDER_SITE_OTHER): Payer: No Typology Code available for payment source | Admitting: Family Medicine

## 2022-12-24 ENCOUNTER — Encounter: Payer: Self-pay | Admitting: Family Medicine

## 2022-12-24 DIAGNOSIS — E349 Endocrine disorder, unspecified: Secondary | ICD-10-CM | POA: Diagnosis not present

## 2022-12-24 DIAGNOSIS — M109 Gout, unspecified: Secondary | ICD-10-CM | POA: Diagnosis not present

## 2022-12-24 DIAGNOSIS — R7303 Prediabetes: Secondary | ICD-10-CM

## 2022-12-24 NOTE — Patient Instructions (Signed)
It was very nice to see you today!  Keep up the great work on diet.    PLEASE NOTE:  If you had any lab tests please let us know if you have not heard back within a few days. You may see your results on MyChart before we have a chance to review them but we will give you a call once they are reviewed by [place]. If we ordered any referrals today, please let us know if you have not heard from their office within the next week.   Please try these tips to maintain a healthy lifestyle:  Eat most of your calories during the day when you are active. Eliminate processed foods including packaged sweets (pies, cakes, cookies), reduce intake of potatoes, white bread, white pasta, and white rice. Look for whole grain options, oat flour or almond flour.  Each meal should contain half fruits/vegetables, one quarter protein, and one quarter carbs (no bigger than a computer mouse).  Cut down on sweet beverages. This includes juice, soda, and sweet tea. Also watch fruit intake, though this is a healthier sweet option, it still contains natural sugar! Limit to 3 servings daily.  Drink at least 1 glass of water with each meal and aim for at least 8 glasses per day  Exercise at least 150 minutes every week.

## 2022-12-24 NOTE — Progress Notes (Signed)
MyChart Video Visit    Virtual Visit via Video Note   This visit type was conducted due to national recommendations for restrictions regarding the COVID-19 Pandemic (e.g. social distancing) in an effort to limit this patient's exposure and mitigate transmission in our community. This patient is at least at moderate risk for complications without adequate follow up. This format is felt to be most appropriate for this patient at this time. Physical exam was limited by quality of the video and audio technology used for the visit. CMA was able to get the patient set up on a video visit.  Patient location: Home. Patient and provider in visit Provider location: Office  I discussed the limitations of evaluation and management by telemedicine and the availability of in person appointments. The patient expressed understanding and agreed to proceed.  Visit Date: 12/24/2022  Today's healthcare provider: Wellington Hampshire, MD     Subjective:    Patient ID: Jerry Schmidt, male    DOB: [birth year], 42 y.o.   MRN: [id number]  Chief Complaint  Patient presents with   Follow-up   Diabetes    HPI Pre DM-working on diet/exercise.  Going to [facility] stable at 5.7 from 11/21/22 labs reviewed Low T-not taking in 3 mo-acne on back-doing better as far as cystic acne.  Also, never seemed to help w/energy, etc.  Gout-since on higher protein diet-some better.  [facility]-R great toe-first episode before Thanksgiving. Taking tart cherry pills as well. Organic vinegar.  Had jammed toe wk prior and eating high protein diet.  H/o kidney stones x 2 in past.  Got refill on cochicine as was having some minimal pain, but more d/t still some swelling. Shrimp will exacerbate.  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     colchicine 0.6 MG tablet Take 1 tablet (0.6 mg total) by mouth 2 (two) times daily. 10 tablet 1   Vitamin D, Ergocalciferol, (DRISDOL) 1.25 MG (50000 UNIT) CAPS capsule Take 1 capsule (50,000 Units total) by mouth every 7 (seven) days. 8 capsule 0   predniSONE (DELTASONE) 10 MG tablet Days 1-4 take 4 tablets (40 mg) daily  Days 5-8 take 3 tablets (30 mg) daily, Days 9-11 take 2 tablets (20 mg) daily, Days 12-14 take 1 tablet (10 mg) daily. 37 tablet 0   SYRINGE-NEEDLE, DISP, 3 ML (LUER LOCK SAFETY SYRINGES) 22G X 1-1/2" 3 ML MISC 1 Syringe by Does not apply route every 14 (fourteen) days. 10 each 3   testosterone cypionate (DEPO-TESTOSTERONE) 200 MG/ML injection Inject 0.5 mLs (100 mg total) into the muscle once a week. 2 mL 5   No facility-administered medications prior to visit.    No Known Allergies      Objective:     Physical Exam  Vitals and nursing note reviewed.  Constitutional:      General:  is not in acute distress.    Appearance: Normal appearance.  HENT:  Head: Normocephalic.  Pulmonary:     Effort: No respiratory distress.  Skin:    General: Skin is dry.     Coloration: Skin is not pale.  Neurological:     Mental Status: Pt is alert and oriented to person, place, and time.  Psychiatric:        Mood and Affect: Mood normal.  R great toe-not red, still some mild swelling.  Reviewed labs from 11/21/22  There were no vitals taken for this visit.  Wt Readings from Last 3 Encounters:  12/20/22 225 lb (102.1 kg)  11/21/22 223 lb (101.2 kg)  09/12/22 226 lb (102.5 kg)       Assessment & Plan:   Problem List Items Addressed This Visit       Other   Testosterone deficiency   Other Visit Diagnoses     Prediabetes     -  Primary   Acute gout involving toe of right foot, unspecified cause         1.  Prediabetes-patient working on diet/exercise.  A1c remains at 5.7, but not worse.  Continue to work on diet/exercise and following with weight management.  Cholesterol-HDL and triglycerides have improved.  LDL got a bit worse. 2.  Low testosterone-patient was on testosterone replacement, however had side effect of acne on his back/buttocks.  He stopped the injections, acne improved.  Since testosterone did not help his energy, he is fine not taking it. 3.  Gout right great toe-probably flared due to injury and high-protein diet.  He was eating a lot of venison.  (Still eating venison, but things have settled down.).  He is refilling the colchicine to try to improve the residual swelling.  Uric acid was 7.6.  He is doing tart cherry supplements and vinegar.  He would like to continue modifying diet.  We discussed prophylactic treatment, however he would like to monitor for now.  Let us know if it continues to flare.  No orders of the defined types were placed in this encounter.   I discussed the assessment and treatment plan with the patient. The patient was provided an opportunity to ask questions and all were answered. The patient agreed with the plan and demonstrated an understanding of the instructions.   The patient was advised to call back or seek an in-person evaluation if the symptoms worsen or if the condition fails to improve as anticipated.  I provided 25 minutes of face-to-face time during this encounter.   Wellington Hampshire, MD Pax [phone] (phone) [phone] (fax)  [place]

## 2023-01-01 NOTE — Progress Notes (Signed)
Chief Complaint:   OBESITY Jerry Schmidt is here to discuss his progress with his obesity treatment plan along with follow-up of his obesity related diagnoses. Jerry Schmidt is on keeping a food journal and adhering to recommended goals of 2300-2800 calories and 140 grams of protein and states he is following his eating plan approximately 70% of the time. Jerry Schmidt states he is using weights 60 minutes 3 times per week.  Today's visit was #: 11 Starting weight: 251 lbs Starting date: 12/20/2021 Today's weight: 225 lbs Today's date: 12/20/2022 Total lbs lost to date: 26 lbs Total lbs lost since last in-office visit: 0  Interim History: Jerry Schmidt has done well with weight loss overall. He has had some recurrent gout symptoms in his right great toe and is following up with his primary care provider next week.  This has limited his activity level. He did decrease his intake of red meat and shrimp and notes that his symptoms overall were improved following a course of colchicine. He still is able to get an adequate protein and calories, but has significantly decreased his protein but is still getting approximately 240 g daily and about 2400 Daily. He is going to the [place] on a cruise for his upcoming 42th birthday.  Subjective:   1. Prediabetes Labs discussed during visit today-worsening.  Insulin 5.6, A1c 5.7 slightly increased.  Discussed working on simple carbs and Jerry Schmidt is interested in trying Low carb nutrition plan over the next few weeks.  No medications, but discussed Metformin, but plan to hold off on starting metformin for now.  2. Other hyperlipidemia Labs discussed during visit today-.  Triglycerides and LDL much improved, but not at goal.  No know family history of CVD on 11/21/22.  Triglycerides decreased 149 from 370.  LDL of 154, HDL of 46 increased from 26.  No medications currently  3. Vitamin D deficiency Labs discussed during visit today.  Jerry Schmidt is on  ergocalciferol once weekly.  Level of 41.1 on 11/21/22.  Not at goal.  No side effects with ergocalciferol 50,000 IU weekly.  4. Acute gout involving toe of right foot, unspecified cause Some recurrent symptoms right great toe gout.  Colchicine helped significantly and would like to refill until he sees PCP next week.  Assessment/Plan:   1. Prediabetes Continue Prescribed Nutrition Plan and exercise to promote weight loss, improve glycemic control and prevent progression to type 2 diabetes. Change to Low carb nutrition plan over the next few weeks.  Recheck labs in 2-3 months.  2. Other hyperlipidemia Continue to decrease sat fat/cholesterol in Prescribed Nutrition Plan and exercise to promote weight loss and improve lipid profile.   3. Vitamin D deficiency Continue ergocalciferol 50,000 IU once weekly.   Will refill ergocalciferol 50,000 IU weekly for 60 days.  4. Acute gout involving toe of right foot, unspecified cause Refill Colchicine 0.6 mg twice a day for 1 month with 0 refills.  -Refill colchicine 0.6 MG tablet; Take 1 tablet (0.6 mg total) by mouth 2 (two) times daily.  Dispense: 10 tablet; Refill: 1  5. Obesity with current BMI of 32.4 Jerry Schmidt is currently in the action stage of change. As such, his goal is to continue with weight loss efforts. He has agreed to keeping a food journal and adhering to recommended goals of 2400 calories and 140+ grams of protein and following a lower carbohydrate, vegetable and lean protein rich diet plan daily.   Exercise goals: As is.  Behavioral modification strategies: increasing lean protein  intake, decreasing simple carbohydrates, meal planning and cooking strategies, and keeping a strict food journal.  Jerry Schmidt has agreed to follow-up with our clinic in 6 weeks. He was informed of the importance of frequent follow-up visits to maximize his success with intensive lifestyle modifications for his multiple health conditions.    Objective:   Blood pressure 118/78, pulse 61, temperature 98 F (36.7 C), height 5' 10"$  (1.778 m), weight 225 lb (102.1 kg), SpO2 100 %. Body mass index is 32.28 kg/m.  General: Cooperative, alert, well developed, in no acute distress. HEENT: Conjunctivae and lids unremarkable. Cardiovascular: Regular rhythm.  Lungs: Normal work of breathing. Neurologic: No focal deficits.   Lab Results  Component Value Date   CREATININE 1.11 11/21/2022   BUN 18 11/21/2022   NA 139 11/21/2022   K 4.4 11/21/2022   CL 99 11/21/2022   CO2 24 11/21/2022   Lab Results  Component Value Date   ALT 28 11/21/2022   AST 19 11/21/2022   ALKPHOS 67 11/21/2022   BILITOT 1.1 11/21/2022   Lab Results  Component Value Date   HGBA1C 5.7 (H) 11/21/2022   HGBA1C 5.7 04/16/2022   HGBA1C 5.7 (H) 12/20/2021   HGBA1C 5.5 08/28/2017   Lab Results  Component Value Date   INSULIN 5.6 11/21/2022   INSULIN 15.7 12/20/2021   Lab Results  Component Value Date   TSH 1.770 12/20/2021   Lab Results  Component Value Date   CHOL 227 (H) 11/21/2022   HDL 46 11/21/2022   LDLCALC 154 (H) 11/21/2022   LDLDIRECT 102.0 04/16/2022   TRIG 149 11/21/2022   CHOLHDL 6 04/16/2022   Lab Results  Component Value Date   VD25OH 41.1 11/21/2022   VD25OH 26.0 (L) 12/20/2021   VD25OH 28.75 (L) 08/28/2017   Lab Results  Component Value Date   WBC 7.7 04/16/2022   HGB 14.5 04/16/2022   HCT 43.2 04/16/2022   MCV 81.2 04/16/2022   PLT 178.0 04/16/2022   No results found for: "IRON", "TIBC", "FERRITIN"  Attestation Statements:   Reviewed by clinician on day of visit: allergies, medications, problem list, medical history, surgical history, family history, social history, and previous encounter notes.  I, Brendell Tyus, am acting as transcriptionist for [employer], PA.  I have reviewed the above documentation for accuracy and completeness, and I agree with the above. -  Aqua Denslow,PA-C

## 2023-01-30 ENCOUNTER — Encounter (INDEPENDENT_AMBULATORY_CARE_PROVIDER_SITE_OTHER): Payer: Self-pay | Admitting: Physician Assistant

## 2023-01-30 ENCOUNTER — Ambulatory Visit (INDEPENDENT_AMBULATORY_CARE_PROVIDER_SITE_OTHER): Payer: No Typology Code available for payment source | Admitting: Physician Assistant

## 2023-01-30 VITALS — BP 129/81 | HR 93 | Temp 98.3°F | Ht 70.0 in | Wt 232.0 lb

## 2023-01-30 DIAGNOSIS — E669 Obesity, unspecified: Secondary | ICD-10-CM | POA: Diagnosis not present

## 2023-01-30 DIAGNOSIS — R7303 Prediabetes: Secondary | ICD-10-CM

## 2023-01-30 DIAGNOSIS — Z6833 Body mass index (BMI) 33.0-33.9, adult: Secondary | ICD-10-CM | POA: Diagnosis not present

## 2023-01-30 DIAGNOSIS — E559 Vitamin D deficiency, unspecified: Secondary | ICD-10-CM

## 2023-01-30 MED ORDER — CHOLECALCIFEROL 1.25 MG (50000 UT) PO TABS: 1.2500 mg | ORAL_TABLET | ORAL | 0 refills | Status: DC

## 2023-01-30 NOTE — Progress Notes (Signed)
Office: [phone]  /  Fax: [phone]  WEIGHT SUMMARY AND BIOMETRICS  Vitals Temp: 98.3 F (36.8 C) BP: 129/81 Pulse Rate: 93 SpO2: 95 %   Anthropometric Measurements Height: '5\' 10"'$  (1.778 m) Weight: 232 lb (105.2 kg) BMI (Calculated): 33.29 Weight at Last Visit: 225 lb Starting Weight: 251 lb Total Weight Loss (lbs): 26 lb (11.8 kg)   Body Composition  Body Fat %: 29.8 % Fat Mass (lbs): 69.4 lbs Muscle Mass (lbs): 155.2 lbs Total Body Water (lbs): 115.2 lbs Visceral Fat Rating : 14   Other Clinical Data Fasting: no Labs: no Today's Visit #: 12 Starting Date: 12/20/21     HPI  Chief Complaint: OBESITY  Jerry Schmidt is here to discuss his progress with his obesity treatment plan. He is on the keeping a food journal and adhering to recommended goals of 2300-2800 calories and 140+ grams of protein and states he is following his eating plan approximately 25 % of the time. He states he is exercising 40 minutes 2 times per week.   Interval History:  Since last office visit he has been on a cruise for his 42th Birthday with his wife and had a great trip.  Walked a lot.  Has been doing upper body strengthening routine regularly and seeing results.  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  No further problems with gout and has been limiting his protein and avoiding eating meats with high purine levels.    Pharmacotherapy: None  PHYSICAL EXAM:  Blood pressure 129/81, pulse 93, temperature 98.3 F (36.8 C), height '5\' 10"'$  (1.778 m), weight 232 lb (105.2 kg), SpO2 95 %. Body mass index is 33.29 kg/m.  General: He is overweight, cooperative, alert, well developed, and in no acute distress. PSYCH: Has normal mood, affect and thought process.   Lungs: Normal breathing effort, no conversational dyspnea.  DIAGNOSTIC DATA REVIEWED:  BMET    Component Value Date/Time   NA 139 11/21/2022 1523    K 4.4 11/21/2022 1523   CL 99 11/21/2022 1523   CO2 24 11/21/2022 1523   GLUCOSE 83 11/21/2022 1523   GLUCOSE 86 04/16/2022 1603   BUN 18 11/21/2022 1523   CREATININE 1.11 11/21/2022 1523   CREATININE 1.02 02/25/2014 1447   CALCIUM 10.0 11/21/2022 1523   GFRNONAA 101 08/12/2020 1519   GFRAA 116 08/12/2020 1519   Lab Results  Component Value Date   HGBA1C 5.7 (H) 11/21/2022   HGBA1C 5.5 08/28/2017   Lab Results  Component Value Date   INSULIN 5.6 11/21/2022   INSULIN 15.7 12/20/2021   Lab Results  Component Value Date   TSH 1.770 12/20/2021   CBC    Component Value Date/Time   WBC 7.7 04/16/2022 1603   RBC 5.32 04/16/2022 1603   HGB 14.5 04/16/2022 1603   HGB 14.6 12/20/2021 1559   HCT 43.2 04/16/2022 1603   HCT 43.0 12/20/2021 1559   PLT 178.0 04/16/2022 1603   PLT 171 12/20/2021 1559   MCV 81.2 04/16/2022 1603   MCV 82 12/20/2021 1559   MCH 27.7 12/20/2021 1559   MCH 28.9 09/11/2017 0018   MCHC 33.6 04/16/2022 1603   RDW 13.7 04/16/2022 1603   RDW 13.7 12/20/2021 1559   Iron Studies No results found for: "IRON", "TIBC", "FERRITIN", "IRONPCTSAT" Lipid Panel     Component Value Date/Time   CHOL 227 (H) 11/21/2022 1523   TRIG 149 11/21/2022 1523   HDL 46 11/21/2022  1523   CHOLHDL 6 04/16/2022 1603   VLDL 74.0 (H) 04/16/2022 1603   LDLCALC 154 (H) 11/21/2022 1523   LDLDIRECT 102.0 04/16/2022 1603   Hepatic Function Panel     Component Value Date/Time   PROT 7.5 11/21/2022 1523   ALBUMIN 5.1 11/21/2022 1523   AST 19 11/21/2022 1523   ALT 28 11/21/2022 1523   ALKPHOS 67 11/21/2022 1523   BILITOT 1.1 11/21/2022 1523      Component Value Date/Time   TSH 1.770 12/20/2021 1559   Nutritional Lab Results  Component Value Date   VD25OH 41.1 11/21/2022   VD25OH 26.0 (L) 12/20/2021   VD25OH 28.75 (L) 08/28/2017     ASSESSMENT AND PLAN  1. Prediabetes Prediabetes Last A1c was 5.7  Medication(s): None Working on nutrition plan to decrease simple  carbohydrates, increase lean proteins and exercise to promote weight loss, improve glycemic control and prevent progression to Type 2 diabetes.   Lab Results  Component Value Date   HGBA1C 5.7 (H) 11/21/2022   HGBA1C 5.7 04/16/2022   HGBA1C 5.7 (H) 12/20/2021   HGBA1C 5.5 08/28/2017   Lab Results  Component Value Date   INSULIN 5.6 11/21/2022   INSULIN 15.7 12/20/2021    Plan: Continue Working on nutrition plan to decrease simple carbohydrates, increase lean proteins and exercise to promote weight loss, improve glycemic control and prevent progression to Type 2 diabetes.   2. Vitamin D deficiency Vitamin D Deficiency Vitamin D is not at goal of 50.  Most recent vitamin D level was 41.1. He is on  prescription ergocalciferol 50,000 IU weekly. Lab Results  Component Value Date   VD25OH 41.1 11/21/2022   VD25OH 26.0 (L) 12/20/2021   VD25OH 28.75 (L) 08/28/2017    Plan: Change to cholecalciferol 50,000 IU once weekly as patient felt it made him feel better than Ergocalciferol. Will plan to recheck Vit D level over the next couple of months to avoid over supplementation.    3. Generalized obesity- Start BMI 36.01 4. BMI Current 33.4  TREATMENT PLAN FOR OBESITY:  Recommended Dietary Goals  Jerry Schmidt is currently in the action stage of change. As such, his goal is to continue weight management plan. He has agreed to keeping a food journal and adhering to recommended goals of 2300-2800 calories and 140+ grams of protein.  Behavioral Intervention  We discussed the following Behavioral Modification Strategies today: increasing lean protein intake, increasing vegetables, increasing water intake, and work on meal planning and easy cooking plans.  Additional resources provided today: NA  Recommended Physical Activity Goals  Jerry Schmidt has been advised to work up to 150 minutes of moderate intensity aerobic activity a week and strengthening exercises 2-3 times per week for  cardiovascular health, weight loss maintenance and preservation of muscle mass.   He has agreed to increase physical activity in their day and reduce sedentary time (increase NEAT).  and continue physical activity as is.   ASSOCIATED CONDITIONS ADDRESSED TODAY   Return in about 4 weeks (around 02/27/2023).Marland Kitchen He was informed of the importance of frequent follow up visits to maximize his success with intensive lifestyle modifications for his multiple health conditions.   ATTESTASTION STATEMENTS:  Reviewed by clinician on day of visit: allergies, medications, problem list, medical history, surgical history, family history, social history, and previous encounter notes.   I have personally spent 30 minutes total time today in preparation, patient care, nutritional counseling and documentation for this visit, including the following: review of clinical lab tests; review  of medical tests/procedures/services.      Genaro Glasscock, PA-C

## 2023-02-25 ENCOUNTER — Telehealth (INDEPENDENT_AMBULATORY_CARE_PROVIDER_SITE_OTHER): Payer: No Typology Code available for payment source | Admitting: Family Medicine

## 2023-02-25 ENCOUNTER — Encounter (INDEPENDENT_AMBULATORY_CARE_PROVIDER_SITE_OTHER): Payer: Self-pay | Admitting: Family Medicine

## 2023-02-25 DIAGNOSIS — R7303 Prediabetes: Secondary | ICD-10-CM | POA: Diagnosis not present

## 2023-02-25 DIAGNOSIS — E559 Vitamin D deficiency, unspecified: Secondary | ICD-10-CM | POA: Diagnosis not present

## 2023-02-25 DIAGNOSIS — Z6833 Body mass index (BMI) 33.0-33.9, adult: Secondary | ICD-10-CM | POA: Diagnosis not present

## 2023-02-25 DIAGNOSIS — E669 Obesity, unspecified: Secondary | ICD-10-CM

## 2023-02-25 MED ORDER — CHOLECALCIFEROL 1.25 MG (50000 UT) PO TABS: 1.2500 mg | ORAL_TABLET | ORAL | 0 refills | Status: DC

## 2023-02-25 NOTE — Progress Notes (Signed)
TeleHealth Visit:  This visit was completed with telemedicine (audio/video) technology. Jerry Schmidt has verbally consented to this TeleHealth visit. The patient is located at home, the provider is located at home. The participants in this visit include the listed provider and patient. The visit was conducted today via MyChart video.  OBESITY Jerry Schmidt is here to discuss his progress with his obesity treatment plan along with follow-up of his obesity related diagnoses.   Today's visit was # 13 Starting weight: 251 lbs Starting date: 12/20/21 Weight at last in office visit: 232 lbs on 01/30/23 Total weight loss: 19 lbs at last in office visit on 01/30/23. Today's reported weight (02/25/23):  232  lbs  Nutrition Plan: keeping a food journal with goal of 2300-2800 calories and 80-90 grams of protein daily -not currently journaling.  Current exercise: Very active working in Biomedical scientist.  Interim History:  He has had a slow increase in weight from October. 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  His family eats out at least once daily 7 days/week.  Sometimes more than once daily.  Yesterday he had a triple dipper platter at [employer] as his only meal.  Today he had 2 breakfast croissant sandwiches from [place] [employer] for breakfast.  He is not journaling but feels that he eats the same foods daily.  He packs his lunch and puts 4 ounces of meat on a sandwich.  He is eating less protein due to flareup of gout.  Trying to get 80 to 90 g/day.  He is having some polyphagia.  Has been successful using phentermine in the past.  He reports he has no coverage for GLP-1 therapy. No contraindications to phentermine.  Has history of nephrolithiasis- last stone 2 to 3 years ago.  His weight goal is 200 pounds.   Assessment/Plan:  1. Prediabetes Last A1c was 5.7  Medication(s): None Lab Results  Component Value Date   HGBA1C 5.7 (H) 11/21/2022   HGBA1C 5.7  04/16/2022   HGBA1C 5.7 (H) 12/20/2021   HGBA1C 5.5 08/28/2017   Lab Results  Component Value Date   INSULIN 5.6 11/21/2022   INSULIN 15.7 12/20/2021    Plan: Encouraged better compliance with journaling.  2. Vitamin D Deficiency Vitamin D is not at goal of 50.  Most recent vitamin D level was 41.1 on 11/21/2022.Marland Kitchen He is on  prescription cholecalciferol 50,000 IU weekly. Lab Results  Component Value Date   VD25OH 41.1 11/21/2022   VD25OH 26.0 (L) 12/20/2021   VD25OH 28.75 (L) 08/28/2017    Plan: Refill prescription vitamin D 50,000 IU weekly.   3. Generalized Obesity: Current BMI 33 1.  Schedule next office visit with Dr. Jearld Schmidt to discuss antiobesity medication-possibly adding phentermine. 2.  Begin journaling at least 5 days weekly.  Jerry Schmidt is currently in the action stage of change. As such, his goal is to continue with weight loss efforts.  He has agreed to keeping a food journal with goal of 2300 calories and 80-90 grams of protein daily.  Exercise goals: All adults should avoid inactivity. Some physical activity is better than none, and adults who participate in any amount of physical activity gain some health benefits.  Behavioral modification strategies: decreasing simple carbohydrates , decrease eating out, and increase frequency of journaling.  Jerry Schmidt has agreed to follow-up with our clinic in 5 weeks.   No orders of the defined types were placed in this encounter.   Medications Discontinued During This Encounter  Medication Reason  Cholecalciferol 1.25 MG (50000 UT) TABS Reorder     Meds ordered this encounter  Medications   Cholecalciferol 1.25 MG (50000 UT) TABS    Sig: Take 1.25 mg by mouth once a week.    Dispense:  4 tablet    Refill:  0    Order Specific Question:   Supervising Provider    Answer:   Jerry Schmidt [[id number]]      Objective:   VITALS: Per patient if applicable, see vitals. GENERAL: Alert and in no acute  distress. CARDIOPULMONARY: No increased WOB. Speaking in clear sentences.  PSYCH: Pleasant and cooperative. Speech normal rate and rhythm. Affect is appropriate. Insight and judgement are appropriate. Attention is focused, linear, and appropriate.  NEURO: Oriented as arrived to appointment on time with no prompting.   Attestation Statements:   Reviewed by clinician on day of visit: allergies, medications, problem list, medical history, surgical history, family history, social history, and previous encounter notes.  This was prepared with the assistance of Presenter, broadcasting.  Occasional wrong-word or sound-a-like substitutions may have occurred due to the inherent limitations of voice recognition software.

## 2023-04-04 ENCOUNTER — Ambulatory Visit (INDEPENDENT_AMBULATORY_CARE_PROVIDER_SITE_OTHER): Payer: No Typology Code available for payment source | Admitting: Family Medicine

## 2023-04-04 ENCOUNTER — Encounter (INDEPENDENT_AMBULATORY_CARE_PROVIDER_SITE_OTHER): Payer: Self-pay | Admitting: Family Medicine

## 2023-04-04 VITALS — BP 121/72 | HR 93 | Temp 97.7°F | Ht 70.0 in | Wt 220.0 lb

## 2023-04-04 DIAGNOSIS — R632 Polyphagia: Secondary | ICD-10-CM | POA: Diagnosis not present

## 2023-04-04 DIAGNOSIS — Z6831 Body mass index (BMI) 31.0-31.9, adult: Secondary | ICD-10-CM

## 2023-04-04 DIAGNOSIS — E7849 Other hyperlipidemia: Secondary | ICD-10-CM | POA: Diagnosis not present

## 2023-04-04 DIAGNOSIS — Z6836 Body mass index (BMI) 36.0-36.9, adult: Secondary | ICD-10-CM

## 2023-04-04 DIAGNOSIS — E669 Obesity, unspecified: Secondary | ICD-10-CM

## 2023-04-04 NOTE — Progress Notes (Signed)
Chief Complaint:   OBESITY Jerry Schmidt is here to discuss his progress with his obesity treatment plan along with follow-up of his obesity related diagnoses. Jerry Schmidt is on keeping a food journal and adhering to recommended goals of 2300 calories and 80-90 grams of protein and states he is following his eating plan approximately 85% of the time. Jerry Schmidt states he is exercising, and doing yard work for 15 minutes 3-4 times per week.  Today's visit was #: 14 Starting weight: 251 lbs Starting date: 12/20/2021 Today's weight: 220 lbs Today's date: 04/04/2023 Total lbs lost to date: 31 Total lbs lost since last in-office visit: 12  Interim History: Patient has been working more recently with hopes to retire at age 26.  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  He is doing this in addition to his regular job.  He wasn't journaling previously and got back on track with his journaling.  Patient is wondering about whether or not he can possibly go back on phentermine which he did in the past.  Unfortunately he has a history of nephrolithiasis and last stone was just a few years ago.    Subjective:   1. Polyphagia Jerry Schmidt still has to be more mindful of his food choices.  He previously did well on phentermine.  PDMP was checked.  Control substance contract was gone over and signed.  2. Other hyperlipidemia Jerry Schmidt's last LDL was 154 on his last check, HDL 46, and triglycerides 161.  He is not on medications.  Assessment/Plan:   1. Polyphagia Jerry Schmidt agreed to start Lomaira 8 mg by mouth twice daily with no refills.  - Phentermine HCl (LOMAIRA) 8 MG TABS; Take 1 tablet (8 mg total) by mouth 2 (two) times daily.  Dispense: 60 tablet; Refill: 0  2. Other hyperlipidemia We will repeat labs in 3 months.  3. BMI 31.0-31.9,adult  4. Obesity with starting BMI of 36.0 Jerry Schmidt is currently in the action stage of change. As such, his goal  is to continue with weight loss efforts. He has agreed to keeping a food journal and adhering to recommended goals of 2000-2200 calories and 140+ grams of protein daily.   Exercise goals: As is.   Behavioral modification strategies: increasing lean protein intake, meal planning and cooking strategies, keeping healthy foods in the home, and planning for success.  Jerry Schmidt has agreed to follow-up with our clinic in 3 weeks. He was informed of the importance of frequent follow-up visits to maximize his success with intensive lifestyle modifications for his multiple health conditions.   Objective:   Blood pressure 121/72, pulse 93, temperature 97.7 F (36.5 C), height 5\' 10"  (1.778 m), weight 220 lb (99.8 kg), SpO2 97 %. Body mass index is 31.57 kg/m.  General: Cooperative, alert, well developed, in no acute distress. HEENT: Conjunctivae and lids unremarkable. Cardiovascular: Regular rhythm.  Lungs: Normal work of breathing. Neurologic: No focal deficits.   Lab Results  Component Value Date   CREATININE 1.11 11/21/2022   BUN 18 11/21/2022   NA 139 11/21/2022   K 4.4 11/21/2022   CL 99 11/21/2022   CO2 24 11/21/2022   Lab Results  Component Value Date   ALT 28 11/21/2022   AST 19 11/21/2022   ALKPHOS 67 11/21/2022   BILITOT 1.1 11/21/2022   Lab Results  Component Value Date   HGBA1C 5.7 (H) 11/21/2022   HGBA1C 5.7 04/16/2022   HGBA1C 5.7 (H) 12/20/2021   HGBA1C  5.5 08/28/2017   Lab Results  Component Value Date   INSULIN 5.6 11/21/2022   INSULIN 15.7 12/20/2021   Lab Results  Component Value Date   TSH 1.770 12/20/2021   Lab Results  Component Value Date   CHOL 227 (H) 11/21/2022   HDL 46 11/21/2022   LDLCALC 154 (H) 11/21/2022   LDLDIRECT 102.0 04/16/2022   TRIG 149 11/21/2022   CHOLHDL 6 04/16/2022   Lab Results  Component Value Date   VD25OH 41.1 11/21/2022   VD25OH 26.0 (L) 12/20/2021   VD25OH 28.75 (L) 08/28/2017   Lab Results  Component Value  Date   WBC 7.7 04/16/2022   HGB 14.5 04/16/2022   HCT 43.2 04/16/2022   MCV 81.2 04/16/2022   PLT 178.0 04/16/2022   No results found for: "IRON", "TIBC", "FERRITIN"  Attestation Statements:   Reviewed by clinician on day of visit: allergies, medications, problem list, medical history, surgical history, family history, social history, and previous encounter notes.   I, Burt Knack, am acting as transcriptionist for Reuben Likes, MD.  I have reviewed the above documentation for accuracy and completeness, and I agree with the above. - Reuben Likes, MD

## 2023-04-08 MED ORDER — LOMAIRA 8 MG PO TABS
8.0000 mg | ORAL_TABLET | Freq: Two times a day (BID) | ORAL | 0 refills | Status: DC
Start: 2023-04-08 — End: 2023-07-02

## 2023-04-11 ENCOUNTER — Encounter (INDEPENDENT_AMBULATORY_CARE_PROVIDER_SITE_OTHER): Payer: Self-pay

## 2023-04-30 ENCOUNTER — Encounter (INDEPENDENT_AMBULATORY_CARE_PROVIDER_SITE_OTHER): Payer: Self-pay | Admitting: Family Medicine

## 2023-04-30 ENCOUNTER — Ambulatory Visit (INDEPENDENT_AMBULATORY_CARE_PROVIDER_SITE_OTHER): Payer: No Typology Code available for payment source | Admitting: Family Medicine

## 2023-04-30 VITALS — BP 109/73 | HR 85 | Temp 98.3°F | Ht 70.0 in | Wt 223.0 lb

## 2023-04-30 DIAGNOSIS — Z6832 Body mass index (BMI) 32.0-32.9, adult: Secondary | ICD-10-CM

## 2023-04-30 DIAGNOSIS — E66812 Obesity, class 2: Secondary | ICD-10-CM

## 2023-04-30 DIAGNOSIS — R632 Polyphagia: Secondary | ICD-10-CM | POA: Diagnosis not present

## 2023-04-30 DIAGNOSIS — E7849 Other hyperlipidemia: Secondary | ICD-10-CM

## 2023-04-30 DIAGNOSIS — E559 Vitamin D deficiency, unspecified: Secondary | ICD-10-CM

## 2023-04-30 DIAGNOSIS — R7303 Prediabetes: Secondary | ICD-10-CM | POA: Diagnosis not present

## 2023-04-30 DIAGNOSIS — E669 Obesity, unspecified: Secondary | ICD-10-CM

## 2023-04-30 NOTE — Progress Notes (Signed)
Chief Complaint:   OBESITY Jerry Schmidt is here to discuss his progress with his obesity treatment plan along with follow-up of his obesity related diagnoses. Jerry Schmidt is on keeping a food journal and adhering to recommended goals of 2000-2200 calories and 140+ grams of protein and states he is following his eating plan approximately 80-85% of the time. Jerry Schmidt states he is doing exercise for 15-20 minutes 3-4 times per week.  Today's visit was #: 15 Starting weight: 251 lbs Starting date: 12/20/2021 Today's weight: 223 lbs Today's date: 04/30/2023 Total lbs lost to date: 28 Total lbs lost since last in-office visit: 0  Interim History: Patient stayed hungry on the lomaira.  He felt hungry all day long and felt it was easier to control intake when he wasn't on it. Was at a ball tournament all day over [facility] Day weekend and worked on Monday. Has a week long tournament in [place] in July. Gets fed whatever the food options are when away. He and his family are going to try to go to the beach after the baseball season is over.  Not taking in alcohol except social occasions.  He has been eating fruit as its light during the day.  He thinks perhaps he just doesn't eat enough.   Subjective:   1. Vitamin D deficiency Patient denies nausea, vomiting, or muscle weakness but notes fatigue. Last Vitamin D level was of 41.1.  2. Other hyperlipidemia Patient is not on medications.  Elevated LDL and triglycerides at his last appointment.  3. Prediabetes Patient's last A1c and insulin level was done 7 months ago.  Last A1c was 5.7.  4. Polyphagia Patient has been on Lomaira the last month but he is noticing significant increase in hunger.  He denies side effects (blood pressure well-controlled).   Assessment/Plan:   1. Vitamin D deficiency We will check labs today, and we will refill prescription Vitamin D 50,000 IU once weekly #4 for 1 month.   - VITAMIN D 25 Hydroxy (Vit-D  Deficiency, Fractures)  2. Other hyperlipidemia We will recheck labs today, and we will follow-up at his next appointment.   - Lipid Panel With LDL/HDL Ratio  3. Prediabetes We will recheck labs today, and we will follow-up at his next appointment.   - Comprehensive metabolic panel - Hemoglobin A1c - Insulin, random  4. Polyphagia Patient agreed to discontinue Lomaira.   5. BMI 32.0-32.9,adult  6. Obesity with starting BMI of 36.0 Jerry Schmidt is currently in the action stage of change. As such, his goal is to continue with weight loss efforts. He has agreed to keeping a food journal and adhering to recommended goals of 2300-2500 calories and 140+ grams of protein daily.   Exercise goals: All adults should avoid inactivity. Some physical activity is better than none, and adults who participate in any amount of physical activity gain some health benefits.  Behavioral modification strategies: increasing lean protein intake, increasing water intake, meal planning and cooking strategies, keeping healthy foods in the home, and planning for success.  Jerry Schmidt has agreed to follow-up with our clinic in 3 to 4 weeks. He was informed of the importance of frequent follow-up visits to maximize his success with intensive lifestyle modifications for his multiple health conditions.   Jerry Schmidt was informed we would discuss his lab results at his next visit unless there is a critical issue that needs to be addressed sooner. Jerry Schmidt agreed to keep his next visit at the agreed upon time to discuss these  results.  Objective:   Blood pressure 109/73, pulse 85, temperature 98.3 F (36.8 C), height 5\' 10"  (1.778 m), weight 223 lb (101.2 kg), SpO2 98 %. Body mass index is 32 kg/m.  General: Cooperative, alert, well developed, in no acute distress. HEENT: Conjunctivae and lids unremarkable. Cardiovascular: Regular rhythm.  Lungs: Normal work of breathing. Neurologic: No focal deficits.    Lab Results  Component Value Date   CREATININE 1.11 11/21/2022   BUN 18 11/21/2022   NA 139 11/21/2022   K 4.4 11/21/2022   CL 99 11/21/2022   CO2 24 11/21/2022   Lab Results  Component Value Date   ALT 28 11/21/2022   AST 19 11/21/2022   ALKPHOS 67 11/21/2022   BILITOT 1.1 11/21/2022   Lab Results  Component Value Date   HGBA1C 5.7 (H) 11/21/2022   HGBA1C 5.7 04/16/2022   HGBA1C 5.7 (H) 12/20/2021   HGBA1C 5.5 08/28/2017   Lab Results  Component Value Date   INSULIN 5.6 11/21/2022   INSULIN 15.7 12/20/2021   Lab Results  Component Value Date   TSH 1.770 12/20/2021   Lab Results  Component Value Date   CHOL 227 (H) 11/21/2022   HDL 46 11/21/2022   LDLCALC 154 (H) 11/21/2022   LDLDIRECT 102.0 04/16/2022   TRIG 149 11/21/2022   CHOLHDL 6 04/16/2022   Lab Results  Component Value Date   VD25OH 41.1 11/21/2022   VD25OH 26.0 (L) 12/20/2021   VD25OH 28.75 (L) 08/28/2017   Lab Results  Component Value Date   WBC 7.7 04/16/2022   HGB 14.5 04/16/2022   HCT 43.2 04/16/2022   MCV 81.2 04/16/2022   PLT 178.0 04/16/2022   No results found for: "IRON", "TIBC", "FERRITIN"  Attestation Statements:   Reviewed by clinician on day of visit: allergies, medications, problem list, medical history, surgical history, family history, social history, and previous encounter notes.   I, Burt Knack, am acting as transcriptionist for Reuben Likes, MD.  I have reviewed the above documentation for accuracy and completeness, and I agree with the above. - Reuben Likes, MD

## 2023-05-01 LAB — LIPID PANEL WITH LDL/HDL RATIO
Cholesterol, Total: 192 mg/dL (ref 100–199)
HDL: 37 mg/dL — ABNORMAL LOW (ref >39)
LDL Chol Calc (NIH): 110 mg/dL — ABNORMAL HIGH (ref 0–99)
LDL/HDL Ratio: 3 ratio (ref 0.0–3.6)
Triglycerides: 257 mg/dL — ABNORMAL HIGH (ref 0–149)
VLDL Cholesterol Cal: 45 mg/dL — ABNORMAL HIGH (ref 5–40)

## 2023-05-01 LAB — COMPREHENSIVE METABOLIC PANEL
ALT: 26 IU/L (ref 0–44)
AST: 20 IU/L (ref 0–40)
Albumin/Globulin Ratio: 1.9 (ref 1.2–2.2)
Albumin: 4.7 g/dL (ref 4.1–5.1)
Alkaline Phosphatase: 70 IU/L (ref 44–121)
BUN/Creatinine Ratio: 17 (ref 9–20)
BUN: 21 mg/dL (ref 6–24)
Bilirubin Total: 1 mg/dL (ref 0.0–1.2)
CO2: 25 mmol/L (ref 20–29)
Calcium: 9.6 mg/dL (ref 8.7–10.2)
Chloride: 102 mmol/L (ref 96–106)
Creatinine, Ser: 1.23 mg/dL (ref 0.76–1.27)
Globulin, Total: 2.5 g/dL (ref 1.5–4.5)
Glucose: 90 mg/dL (ref 70–99)
Potassium: 4.4 mmol/L (ref 3.5–5.2)
Sodium: 141 mmol/L (ref 134–144)
Total Protein: 7.2 g/dL (ref 6.0–8.5)
eGFR: 76 mL/min/{1.73_m2} (ref >59)

## 2023-05-01 LAB — HEMOGLOBIN A1C
Est. average glucose Bld gHb Est-mCnc: 111 mg/dL
Hgb A1c MFr Bld: 5.5 % (ref 4.8–5.6)

## 2023-05-01 LAB — INSULIN, RANDOM: INSULIN: 6.5 u[IU]/mL (ref 2.6–24.9)

## 2023-05-01 LAB — VITAMIN D 25 HYDROXY (VIT D DEFICIENCY, FRACTURES): Vit D, 25-Hydroxy: 55.7 ng/mL (ref 30.0–100.0)

## 2023-05-07 MED ORDER — CHOLECALCIFEROL 1.25 MG (50000 UT) PO TABS: 1.2500 mg | ORAL_TABLET | ORAL | 0 refills | Status: DC

## 2023-05-23 ENCOUNTER — Telehealth (INDEPENDENT_AMBULATORY_CARE_PROVIDER_SITE_OTHER): Payer: No Typology Code available for payment source | Admitting: Family Medicine

## 2023-05-23 ENCOUNTER — Encounter (INDEPENDENT_AMBULATORY_CARE_PROVIDER_SITE_OTHER): Payer: Self-pay | Admitting: Family Medicine

## 2023-05-23 DIAGNOSIS — E559 Vitamin D deficiency, unspecified: Secondary | ICD-10-CM

## 2023-05-23 DIAGNOSIS — E7849 Other hyperlipidemia: Secondary | ICD-10-CM

## 2023-05-23 DIAGNOSIS — E669 Obesity, unspecified: Secondary | ICD-10-CM

## 2023-05-23 DIAGNOSIS — Z6832 Body mass index (BMI) 32.0-32.9, adult: Secondary | ICD-10-CM

## 2023-05-23 MED ORDER — CHOLECALCIFEROL 1.25 MG (50000 UT) PO TABS
1.2500 mg | ORAL_TABLET | ORAL | 0 refills | Status: DC
Start: 2023-05-23 — End: 2023-07-02

## 2023-05-23 NOTE — Progress Notes (Signed)
TeleHealth Visit:  This visit was completed with telemedicine (audio/video) technology. Jerry Schmidt has verbally consented to this TeleHealth visit. The patient is located at home, the provider is located at home. The participants in this visit include the listed provider and patient. The visit was conducted today via MyChart video.  OBESITY Jerry Schmidt is here to discuss his progress with his obesity treatment plan along with follow-up of his obesity related diagnoses.   Today's visit was # 16 Starting weight: 251 lbs Starting date: 12/20/2021 Weight at last in office visit: 223 lbs on 04/30/23 Total weight loss: 28 lbs at last in office visit on 04/30/23. Today's reported weight (05/23/23):  219 lbs  Nutrition Plan: keeping a food journal with goal of 2300-2500 calories and 140 grams of protein daily - 90% adherence.  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  He starts work at 4 am.   He is not hungry at all on days that are very hot. He eats very little on those days until he gets home because he feels nauseated if he eats. He tracks well until dinner. Even though he doesn't track at dinner he doesn't feel he overeats. He is unhappy with the fact that he is not losing abdominal fat and fat on his thighs.  He was on Lomaira but he didn't feel it helped with appetite.  Assessment/Plan:  We discussed recent lab results in depth.  1. Vitamin D Deficiency Vitamin D is at goal of 50.  Most recent vitamin D level was 55.7. He is on  prescription cholecalciferol 50,000 IU weekly. Lab Results  Component Value Date   VD25OH 55.7 04/30/2023   VD25OH 41.1 11/21/2022   VD25OH 26.0 (L) 12/20/2021    Plan: Continue and refill  prescription cholecalciferol 50,000 IU weekly   2. Hyperlipidemia LDL improved-110 when checked on  04/30/2023, down from 153 in December 2023.  However triglycerides were elevated at 257.  HDL slightly low at 37. Medication(s): none  Lab Results  Component Value Date   CHOL 192 04/30/2023   HDL 37 (L) 04/30/2023   LDLCALC 110 (H) 04/30/2023   LDLDIRECT 102.0 04/16/2022   TRIG 257 (H) 04/30/2023   CHOLHDL 6 04/16/2022   CHOLHDL 6.8 (H) 08/12/2020   CHOLHDL 7 08/28/2017   Lab Results  Component Value Date   ALT 26 04/30/2023   AST 20 04/30/2023   ALKPHOS 70 04/30/2023   BILITOT 1.0 04/30/2023   The 10-year ASCVD risk score (Arnett DK, et al., 2019) is: 1.2%   Values used to calculate the score:     Age: 42 years     Sex: Male     Is Non-Hispanic African American: No     Diabetic: No     Tobacco smoker: No     Systolic Blood Pressure: 109 mmHg     Is BP treated: No     HDL Cholesterol: 37 mg/dL     Total Cholesterol: 192 mg/dL  Plan: Encouraged exercise if he can find the time which may help him improve fat distribution to some extent. Work on carbohydrate reduction.  3. Generalized Obesity: Current BMI 32 Jerry Schmidt is currently in the action stage of change. As such, his goal is to continue with weight loss efforts.  He has agreed to keeping a food journal with goal of 2300-2500 calories and 140 grams of  protein daily.  Exercise goals: We discussed that to make the improvements in physique that he is looking for he will likely have to spend significant amount of time doing strength training. Encourage strength training if he can find the time.  Behavioral modification strategies: decreasing simple carbohydrates , keep a strict food journal, and increase frequency of journaling.  Jerry Schmidt has agreed to follow-up with our clinic in 5 weeks.  No orders of the defined types were placed in this encounter.   Medications Discontinued During This Encounter  Medication Reason   Cholecalciferol 1.25 MG (50000 UT) TABS Reorder     Meds ordered this encounter   Medications   Cholecalciferol 1.25 MG (50000 UT) TABS    Sig: Take 1.25 mg by mouth once a week.    Dispense:  4 tablet    Refill:  0    Order Specific Question:   Supervising Provider    Answer:   Glennis Brink [[id number]]      Objective:   VITALS: Per patient if applicable, see vitals. GENERAL: Alert and in no acute distress. CARDIOPULMONARY: No increased WOB. Speaking in clear sentences.  PSYCH: Pleasant and cooperative. Speech normal rate and rhythm. Affect is appropriate. Insight and judgement are appropriate. Attention is focused, linear, and appropriate.  NEURO: Oriented as arrived to appointment on time with no prompting.   Attestation Statements:   Reviewed by clinician on day of visit: allergies, medications, problem list, medical history, surgical history, family history, social history, and previous encounter notes.   This was prepared with the assistance of Engineer, civil (consulting).  Occasional wrong-word or sound-a-like substitutions may have occurred due to the inherent limitations of voice recognition software.

## 2023-07-02 ENCOUNTER — Encounter (INDEPENDENT_AMBULATORY_CARE_PROVIDER_SITE_OTHER): Payer: Self-pay | Admitting: Family Medicine

## 2023-07-02 ENCOUNTER — Ambulatory Visit (INDEPENDENT_AMBULATORY_CARE_PROVIDER_SITE_OTHER): Payer: No Typology Code available for payment source | Admitting: Family Medicine

## 2023-07-02 VITALS — BP 112/78 | HR 84 | Temp 98.0°F | Ht 70.0 in | Wt 228.0 lb

## 2023-07-02 DIAGNOSIS — E669 Obesity, unspecified: Secondary | ICD-10-CM | POA: Diagnosis not present

## 2023-07-02 DIAGNOSIS — E559 Vitamin D deficiency, unspecified: Secondary | ICD-10-CM | POA: Diagnosis not present

## 2023-07-02 DIAGNOSIS — R7303 Prediabetes: Secondary | ICD-10-CM

## 2023-07-02 DIAGNOSIS — Z6833 Body mass index (BMI) 33.0-33.9, adult: Secondary | ICD-10-CM

## 2023-07-02 DIAGNOSIS — Z6832 Body mass index (BMI) 32.0-32.9, adult: Secondary | ICD-10-CM

## 2023-07-02 DIAGNOSIS — Z6836 Body mass index (BMI) 36.0-36.9, adult: Secondary | ICD-10-CM

## 2023-07-02 MED ORDER — CHOLECALCIFEROL 1.25 MG (50000 UT) PO TABS: 1.2500 mg | ORAL_TABLET | ORAL | 0 refills | Status: DC

## 2023-07-02 NOTE — Progress Notes (Signed)
Chief Complaint:   OBESITY Jerry Schmidt is here to discuss his progress with his obesity treatment plan along with follow-up of his obesity related diagnoses. Jerry Schmidt is on keeping a food journal and adhering to recommended goals of 2300-2500 calories and 140+ grams of protein and states he is following his eating plan approximately 45% of the time. Jerry Schmidt states he started exercising 1 weeks ago for 75 minutes 3 times per week.  Today's visit was #: 17 Starting weight: 251 lbs Starting date: 12/20/2021 Today's weight: 228 lbs Today's date: 07/02/2023 Total lbs lost to date: 23 Total lbs lost since last in-office visit: 0  Interim History: Patient went to the beach since the last appointment for 10 days.  He mentions he was there was a basketball tournament.  He did not necessarily follow any meal plan while away.  He also had a 10 travel to [place] to Jerry Schmidt for a tournament.  He started exercising as well with his son and that has kept him consistent with that for the last week. He and his family has been eating out quite a bit while away.  Next few weeks he is anticipating staying local for the next few weeks.  Subjective:   1. Vitamin D deficiency Patient was on prescription vitamin D previously, but he has been out of vitamin D for 6 weeks.  His last vitamin D level was 55.7.  I discussed labs with the patient today.  2. Prediabetes Patient's last A1c was 5.5 and insulin close to normal.  He is not on medications.  Assessment/Plan:   1. Vitamin D deficiency Patient will continue prescription vitamin D once weekly, and we will refill for 1 month.  - Cholecalciferol 1.25 MG (50000 UT) TABS; Take 1.25 mg by mouth once a week.  Dispense: 4 tablet; Refill: 0  2. Prediabetes We will repeat labs in 4 months.  3. BMI 33.0-33.9,adult  4. Obesity with starting BMI of 36.0 Jerry Schmidt is currently in the action stage of change. As such, his goal is to continue with weight  loss efforts. He has agreed to keeping a food journal and adhering to recommended goals of 2300-2500 calories and 140+ grams of protein daily.   Exercise goals: All adults should avoid inactivity. Some physical activity is better than none, and adults who participate in any amount of physical activity gain some health benefits.  Behavioral modification strategies: increasing lean protein intake, meal planning and cooking strategies, keeping healthy foods in the home, and planning for success.  Crit has agreed to follow-up with our clinic in 7 weeks. He was informed of the importance of frequent follow-up visits to maximize his success with intensive lifestyle modifications for his multiple health conditions.   Objective:   Blood pressure 112/78, pulse 84, temperature 98 F (36.7 C), height 5\' 10"  (1.778 m), weight 228 lb (103.4 kg), SpO2 99%. Body mass index is 32.71 kg/m.  General: Cooperative, alert, well developed, in no acute distress. HEENT: Conjunctivae and lids unremarkable. Cardiovascular: Regular rhythm.  Lungs: Normal work of breathing. Neurologic: No focal deficits.   Lab Results  Component Value Date   CREATININE 1.23 04/30/2023   BUN 21 04/30/2023   NA 141 04/30/2023   K 4.4 04/30/2023   CL 102 04/30/2023   CO2 25 04/30/2023   Lab Results  Component Value Date   ALT 26 04/30/2023   AST 20 04/30/2023   ALKPHOS 70 04/30/2023   BILITOT 1.0 04/30/2023   Lab Results  Component  Value Date   HGBA1C 5.5 04/30/2023   HGBA1C 5.7 (H) 11/21/2022   HGBA1C 5.7 04/16/2022   HGBA1C 5.7 (H) 12/20/2021   HGBA1C 5.5 08/28/2017   Lab Results  Component Value Date   INSULIN 6.5 04/30/2023   INSULIN 5.6 11/21/2022   INSULIN 15.7 12/20/2021   Lab Results  Component Value Date   TSH 1.770 12/20/2021   Lab Results  Component Value Date   CHOL 192 04/30/2023   HDL 37 (L) 04/30/2023   LDLCALC 110 (H) 04/30/2023   LDLDIRECT 102.0 04/16/2022   TRIG 257 (H)  04/30/2023   CHOLHDL 6 04/16/2022   Lab Results  Component Value Date   VD25OH 55.7 04/30/2023   VD25OH 41.1 11/21/2022   VD25OH 26.0 (L) 12/20/2021   Lab Results  Component Value Date   WBC 7.7 04/16/2022   HGB 14.5 04/16/2022   HCT 43.2 04/16/2022   MCV 81.2 04/16/2022   PLT 178.0 04/16/2022   No results found for: "IRON", "TIBC", "FERRITIN"  Attestation Statements:   Reviewed by clinician on day of visit: allergies, medications, problem list, medical history, surgical history, family history, social history, and previous encounter notes.   I, Burt Knack, am acting as transcriptionist for Reuben Likes, MD.  I have reviewed the above documentation for accuracy and completeness, and I agree with the above. - Reuben Likes, MD

## 2023-07-30 ENCOUNTER — Ambulatory Visit (INDEPENDENT_AMBULATORY_CARE_PROVIDER_SITE_OTHER): Payer: No Typology Code available for payment source | Admitting: Family Medicine

## 2023-09-09 ENCOUNTER — Encounter (INDEPENDENT_AMBULATORY_CARE_PROVIDER_SITE_OTHER): Payer: Self-pay | Admitting: Adult Health

## 2023-09-09 ENCOUNTER — Ambulatory Visit (INDEPENDENT_AMBULATORY_CARE_PROVIDER_SITE_OTHER): Payer: No Typology Code available for payment source | Admitting: Adult Health

## 2023-09-09 VITALS — BP 127/87 | HR 92 | Temp 97.6°F | Ht 70.0 in | Wt 231.0 lb

## 2023-09-09 DIAGNOSIS — E785 Hyperlipidemia, unspecified: Secondary | ICD-10-CM | POA: Diagnosis not present

## 2023-09-09 DIAGNOSIS — E66812 Obesity, class 2: Secondary | ICD-10-CM

## 2023-09-09 DIAGNOSIS — E669 Obesity, unspecified: Secondary | ICD-10-CM | POA: Diagnosis not present

## 2023-09-09 DIAGNOSIS — E349 Endocrine disorder, unspecified: Secondary | ICD-10-CM | POA: Diagnosis not present

## 2023-09-09 DIAGNOSIS — E559 Vitamin D deficiency, unspecified: Secondary | ICD-10-CM | POA: Diagnosis not present

## 2023-09-09 DIAGNOSIS — Z6833 Body mass index (BMI) 33.0-33.9, adult: Secondary | ICD-10-CM

## 2023-09-09 MED ORDER — CHOLECALCIFEROL 1.25 MG (50000 UT) PO TABS
1.2500 mg | ORAL_TABLET | ORAL | 0 refills | Status: DC
Start: 2023-09-09 — End: 2024-02-17

## 2023-09-09 MED ORDER — WEGOVY 0.25 MG/0.5ML ~~LOC~~ SOAJ: 0.2500 mg | SUBCUTANEOUS | 0 refills | Status: DC

## 2023-09-09 NOTE — Progress Notes (Signed)
WEIGHT SUMMARY AND BIOMETRICS  Vitals Temp: 97.6 F (36.4 C) BP: 127/87 Pulse Rate: 92 SpO2: 98 %   Anthropometric Measurements Height: 5\' 10"  (1.778 m) Weight: 231 lb (104.8 kg) BMI (Calculated): 33.15 Weight at Last Visit: 228lb Weight Lost Since Last Visit: 0 Weight Gained Since Last Visit: 3lb Starting Weight: 251lb Total Weight Loss (lbs): 20 lb (9.072 kg)   Body Composition  Body Fat %: 29.5 % Fat Mass (lbs): 68.4 lbs Muscle Mass (lbs): 155.2 lbs Total Body Water (lbs): 111.6 lbs Visceral Fat Rating : 14   Other Clinical Data Fasting: no Labs: no Today's Visit #: 18 Starting Date: 12/20/21    Chief Complaint:   OBESITY Jerry Schmidt is here to discuss his progress with his obesity treatment plan. He is on the keeping a food journal and adhering to recommended goals of 2300-2500 calories and 140g + protein and states he is following his eating plan approximately 60 % of the time. He states he is exercising Strength Training 20 minutes 5 times per week.   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 Testosterone deficiency  Latest Reference Range & Units 01/22/22 08:34 01/29/22 08:15 04/16/22 16:03  Testosterone 300.00 - 890.00 ng/dL 696.29 (L) 528.41 (L) 324.40  (L): Data is abnormally low  PCP/Dr. Ruthine Dose previously managed    testosterone cypionate (DEPOTESTOSTERONE CYPIONATE) injection 200 mg    Last dose May 2023, has not been seen by PCP since then  He endorses  persistent fatigue  2. Vitamin D deficiency  Latest Reference Range & Units 12/20/21 15:59 11/21/22 15:23 04/30/23 07:50  Vitamin D, 25-Hydroxy 30.0 - 100.0 ng/mL 26.0 (L) 41.1 55.7  (L): Data is abnormally low  He is on weekly Ergocalciferol-denies N/V/Muscle Weakness  3. Dyslipidemia Lipid Panel     Component Value Date/Time   CHOL 192 04/30/2023 0750   TRIG 257 (H) 04/30/2023 0750   HDL 37 (L) 04/30/2023 0750   CHOLHDL 6 04/16/2022 1603   VLDL 74.0 (H) 04/16/2022 1603   LDLCALC 110 (H) 04/30/2023 0750   LDLDIRECT 102.0 04/16/2022 1603   LABVLDL 45 (H) 04/30/2023 0750   The 10-year ASCVD risk score (Arnett DK, et al., 2019) is: 1.6%   Values used to calculate the score:     Age: 42 years     Sex: Male     Is Non-Hispanic African American: No     Diabetic: No     Tobacco smoker: No     Systolic Blood Pressure: 127 mmHg     Is BP treated: No     HDL Cholesterol: 37 mg/dL     Total Cholesterol: 192 mg/dL   He is not on statin therapy He denies tobacco/vape use  Assessment/Plan:   1. Testosterone deficiency F/u with PCP or Urologist  2. Vitamin D deficiency Refill - Cholecalciferol 1.25 MG (50000 UT) TABS; Take 1.25 mg by mouth once a week.  Dispense: 4 tablet; Refill: 0  3. Dyslipidemia Limit saturated fat and increase  cardiovascular exercise [facility] therapy  4. BMI 33.0-33.9,adult, Current BMI 33.15 Start  Semaglutide-Weight Management (WEGOVY) 0.25 MG/0.5ML SOAJ Inject 0.25 mg into the skin once a week. Dispense: 2 mL, Refills: 0 ordered   [name] is currently in the action stage of change. As such, his goal is to continue with weight loss efforts. He has agreed to keeping a food journal and adhering to recommended goals of 2300-2500 calories and 140g+ protein.   Exercise goals: For substantial health benefits, adults should do at least 150 minutes (2 hours and 30 minutes) a week of moderate-intensity, or 75 minutes (1 hour and 15 minutes) a week of  vigorous-intensity aerobic physical activity, or an equivalent combination of moderate- and vigorous-intensity aerobic activity. Aerobic activity should be performed in episodes of at least 10 minutes, and preferably, it should be spread throughout the week.  Behavioral modification strategies: increasing lean protein intake, decreasing simple carbohydrates, increasing vegetables, increasing water intake, no skipping meals, meal planning and cooking strategies, keeping healthy foods in the home, and planning for success.  [name] has agreed to follow-up with our clinic in 4 weeks. He was informed of the importance of frequent follow-up visits to maximize his success with intensive lifestyle modifications for his multiple health conditions.   Objective:   Blood pressure 127/87, pulse 92, temperature 97.6 F (36.4 C), height 5\' 10"  (1.778 m), weight 231 lb (104.8 kg), SpO2 98%. Body mass index is 33.15 kg/m.  General: Cooperative, alert, well developed, in no acute distress. HEENT: Conjunctivae and lids unremarkable. Cardiovascular: Regular rhythm.  Lungs: Normal work of breathing. Neurologic: No focal deficits.   Lab Results  Component Value Date   CREATININE 1.23 04/30/2023   BUN 21 04/30/2023   NA 141 04/30/2023   K 4.4 04/30/2023   CL 102 04/30/2023   CO2 25 04/30/2023   Lab Results  Component Value Date   ALT 26 04/30/2023   AST 20 04/30/2023   ALKPHOS 70 04/30/2023   BILITOT 1.0 04/30/2023   Lab Results  Component Value Date   HGBA1C 5.5 04/30/2023   HGBA1C 5.7 (H) 11/21/2022   HGBA1C 5.7 04/16/2022   HGBA1C 5.7 (H) 12/20/2021   HGBA1C 5.5 08/28/2017   Lab Results  Component Value Date   INSULIN 6.5 04/30/2023   INSULIN 5.6 11/21/2022   INSULIN 15.7 12/20/2021   Lab Results  Component Value Date   TSH 1.770 12/20/2021   Lab Results  Component Value Date   CHOL 192 04/30/2023   HDL 37 (L) 04/30/2023   LDLCALC 110 (H) 04/30/2023   LDLDIRECT 102.0  04/16/2022   TRIG 257 (H) 04/30/2023   CHOLHDL 6 04/16/2022   Lab Results  Component Value Date   VD25OH 55.7 04/30/2023   VD25OH 41.1 11/21/2022   VD25OH 26.0 (L) 12/20/2021   Lab Results  Component Value Date   WBC 7.7 04/16/2022   HGB 14.5 04/16/2022   HCT 43.2 04/16/2022   MCV 81.2 04/16/2022   PLT 178.0 04/16/2022   No results found for: "IRON", "TIBC", "FERRITIN"  Attestation Statements:   Reviewed by clinician on day of visit: allergies, medications, problem list, medical history, surgical history, family history, social history, and previous encounter notes.  I have reviewed the above documentation for accuracy and completeness, and I agree with the above. -  Adrion Menz d. Zaia Carre, NP-C

## 2023-09-12 ENCOUNTER — Ambulatory Visit (INDEPENDENT_AMBULATORY_CARE_PROVIDER_SITE_OTHER): Payer: No Typology Code available for payment source | Admitting: Family Medicine

## 2023-10-07 ENCOUNTER — Ambulatory Visit (INDEPENDENT_AMBULATORY_CARE_PROVIDER_SITE_OTHER): Payer: No Typology Code available for payment source | Admitting: Adult Health

## 2024-01-13 ENCOUNTER — Encounter: Payer: No Typology Code available for payment source | Admitting: Family Medicine

## 2024-02-17 ENCOUNTER — Ambulatory Visit (INDEPENDENT_AMBULATORY_CARE_PROVIDER_SITE_OTHER): Payer: No Typology Code available for payment source | Admitting: Family Medicine

## 2024-02-17 ENCOUNTER — Encounter: Payer: Self-pay | Admitting: Family Medicine

## 2024-02-17 VITALS — BP 119/82 | HR 77 | Temp 97.5°F | Resp 18 | Ht 70.0 in | Wt 249.5 lb

## 2024-02-17 DIAGNOSIS — Z6835 Body mass index (BMI) 35.0-35.9, adult: Secondary | ICD-10-CM

## 2024-02-17 DIAGNOSIS — E66812 Obesity, class 2: Secondary | ICD-10-CM

## 2024-02-17 DIAGNOSIS — Z0000 Encounter for general adult medical examination without abnormal findings: Secondary | ICD-10-CM | POA: Diagnosis not present

## 2024-02-17 DIAGNOSIS — Z1159 Encounter for screening for other viral diseases: Secondary | ICD-10-CM | POA: Diagnosis not present

## 2024-02-17 DIAGNOSIS — R7303 Prediabetes: Secondary | ICD-10-CM

## 2024-02-17 MED ORDER — PHENTERMINE HCL 37.5 MG PO TABS
37.5000 mg | ORAL_TABLET | Freq: Every day | ORAL | 0 refills | Status: DC
Start: 2024-02-17 — End: 2024-03-23

## 2024-02-17 NOTE — Patient Instructions (Signed)

## 2024-02-17 NOTE — Progress Notes (Signed)
 Phone: [phone]   Subjective:  Patient 43 y.o. male presenting for annual physical.  Chief Complaint  Patient presents with   Annual Exam    CPE Discuss getting back on phentermine   Annual-exercising Discussed the use of AI scribe software for clinical note transcription with the patient, who gave verbal consent to proceed.  History of Present Illness Jerry Schmidt is a 43 year old male who presents with weight management and medication options.  He has been experiencing challenges with weight management, having previously found phentermine effective for weight loss until reaching a plateau. He is interested in restarting phentermine after regaining some weight during the holidays. He was advised to try Wegovy injections but is hesitant due to a dislike of injections and the cost, which is not fully covered by his insurance.  His weight loss journey includes reaching a weight of 220 pounds but struggling to maintain further progress. He attributes some weight gain to the holidays and acknowledges lapses in dietary discipline during that time. He is attempting to maintain a 2300 calorie diet and engages in physical activities, including playing basketball and working out with his son. Was going to healthy wt and wellness  He experiences plantar fasciitis, causing discomfort in his feet, particularly after physical activities. He uses a massage gun for relief and manages his symptoms with stretching and other measures.  He is concerned about developing diabetes, citing a family history where his grandmother had diabetes and suffered complications. He is motivated to manage his weight to prevent diabetes and is interested in having his labs checked to monitor his health status.  He has a history of using testosterone shots, which he discontinued due to adverse skin reactions. His testosterone levels were near normal during his last check-up.  No major headaches, migraines,  dizziness, syncope, chest pains, palpitations, cough, dyspnea, vomiting, diarrhea, constipation, heartburn, urinary difficulties, erectile dysfunction, or feelings of depression or suicidal thoughts. He feels generally good aside from being overweight.    See problem oriented charting- ROS- ROS: Gen: no fever, chills  Skin: no rash, itching ENT: no ear pain, ear drainage, nasal congestion, rhinorrhea, sinus pressure, sore throat Eyes: no blurry vision, double vision Resp: no cough, wheeze,SOB CV: no CP, palpitations, LE edema,  GI: no heartburn, n/v/d/c, abd pain GU: no dysuria, urgency, frequency, hematuria MSK: acey at times. 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    Family History  Problem Relation Age of Onset   Obesity Mother    Thyroid disease Mother    Depression Mother    Diabetes Mother    Drug abuse Mother    Hyperlipidemia Mother    Hypertension Mother    Bipolar disorder Mother    Schizophrenia Mother    Hyperlipidemia Father    Hypertension Father    Obesity Father  Drug abuse Brother    Diabetes Maternal Grandmother    Drug abuse Maternal Grandmother     Medications- reviewed and updated Current Outpatient Medications  Medication Sig Dispense Refill   phentermine (ADIPEX-P) 37.5 MG tablet Take 1 tablet (37.5 mg total) by mouth daily before breakfast. 30 tablet 0    No current facility-administered medications for this visit.    Allergies-reviewed and updated No Known Allergies  Social History   Social History Narrative   [employer]-driving   Objective  Objective:  BP 119/82   Pulse 77   Temp (!) 97.5 F (36.4 C) (Temporal)   Resp 18   Ht 5\' 10"  (1.778 m)   Wt 249 lb 8 oz (113.2 kg)   SpO2 97%   BMI 35.80 kg/m  Physical Exam  Gen: WDWN NAD HEENT: NCAT, conjunctiva not injected, sclera nonicteric TM WNL B, OP moist, no exudates  NECK:  supple, no thyromegaly, no nodes, no carotid bruits CARDIAC: RRR, S1S2+, no murmur. DP 2+B LUNGS: CTAB. No wheezes ABDOMEN:  BS+, soft, NTND, No HSM, no masses EXT:  no edema MSK: no gross abnormalities. MS 5/5 all 4 NEURO: A&O x3.  CN II-XII intact.  PSYCH: normal mood. Good eye contact     Assessment and Plan   Health Maintenance counseling: 1. Anticipatory guidance: Patient counseled regarding regular dental exams q6 months, eye exams yearly, avoiding smoking and second hand smoke, limiting alcohol to 2 beverages per day.   2. Risk factor reduction:  Advised patient of need for regular exercise and diet rich in fruits and vegetables to reduce risk of heart attack and stroke. Exercise- +.   Wt Readings from Last 3 Encounters:  02/17/24 249 lb 8 oz (113.2 kg)  09/09/23 231 lb (104.8 kg)  07/02/23 228 lb (103.4 kg)   3. Immunizations/screenings/ancillary studies Immunization History  Administered Date(s) Administered   DTaP 03/20/1983, 06/01/1983, 10/02/1983, 10/08/1985, 07/19/1988   Hepatitis B 06/25/2000   IPV 03/20/1983, 06/01/1983, 10/08/1985, 07/19/1988   Influenza-Unspecified 11/29/2017   MMR 07/07/1984, 06/25/2000   Td 02/14/2000   Tdap 08/28/2017, 07/11/2019   Health Maintenance Due  Topic Date Due   HIV Screening  Never done   Hepatitis C Screening  Never done    4. Prostate cancer screening >55yo - risk factors? No results found for: "PSA"  5. Colon cancer  screening:n/a 6. Skin cancer screening- I advised regular sunscreen use. Denies worrisome, changing, or new skin lesions.  7. Smoking associated screening (lung cancer screening, AAA screen 65-75, UA)- non smoker   Wellness examination -     CBC with Differential/Platelet; Future -     Comprehensive metabolic panel; Future -     Hemoglobin A1c; Future -     Hepatitis C antibody; Future -     HIV Antibody (routine testing w rflx); Future -     Lipid panel; Future -     TSH; Future  Prediabetes  Screening for viral disease -     Hepatitis C antibody; Future -     HIV Antibody (routine testing w rflx); Future  Class 2 severe obesity due to excess calories with serious comorbidity and body mass index (BMI) of 35.0 to 35.9 in adult [facility])  Other orders -     Phentermine HCl; Take 1 tablet (37.5 mg total) by mouth daily before breakfast.  Dispense: 30 tablet; Refill: 0   Wellness-anticipatory guidance.  Work on Diet/Exercise  Check CBC,CMP,lipids,TSH, A1C.  F/u 1 yr  Assessment and Plan Assessment &  Plan Obesity   He has experienced a plateau in weight loss after significant success with phentermine but has since regained some weight. He is hesitant to use Wegovy due to cost and preference against injections. Aware of the need for lifestyle changes, including diet and exercise, he is motivated to lose weight to prevent diabetes, which runs in his family. He acknowledges that phentermine cannot be used indefinitely and is considering restarting it to achieve further weight loss before resuming a weight program. Prescribe phentermine to be filled at [facility] on [address]. Encourage continuation of lifestyle modifications, including a 2300 calorie diet and regular exercise. Discuss the importance of accountability and regular check-ins for weight management.  Plantar Fasciitis   He reports plantar fasciitis with pain extending from the heel to the leg. He uses a massage gun for relief and  understands the need for stretching exercises. He was advised on the potential inflammatory effects of sugary drinks, which may exacerbate symptoms. Recommend calf raises and stretching exercises. Advise against walking barefoot. Encourage use of a massage gun for relief.  General Health Maintenance   Concerned about developing diabetes due to family history and recent weight gain, he is motivated to prevent it and is interested in having his labs checked. Discussed the impact of sugary drinks and the importance of reducing soda consumption to lower calorie intake and inflammation. Order fasting blood work to assess for diabetes and other health parameters. Advise on the importance of maintaining a healthy lifestyle to prevent diabetes. Encourage reduction in soda consumption.    Recommended follow up: Return in about 4 weeks (around 03/16/2024) for wt-me.  Lab/Order associations:return fasting   Angelena Sole, MD

## 2024-03-23 ENCOUNTER — Encounter: Payer: Self-pay | Admitting: Family Medicine

## 2024-03-23 ENCOUNTER — Ambulatory Visit: Admitting: Family Medicine

## 2024-03-23 ENCOUNTER — Telehealth (INDEPENDENT_AMBULATORY_CARE_PROVIDER_SITE_OTHER): Admitting: Family Medicine

## 2024-03-23 VITALS — Ht 70.0 in | Wt 232.0 lb

## 2024-03-23 DIAGNOSIS — Z6835 Body mass index (BMI) 35.0-35.9, adult: Secondary | ICD-10-CM

## 2024-03-23 DIAGNOSIS — E66812 Obesity, class 2: Secondary | ICD-10-CM | POA: Diagnosis not present

## 2024-03-23 DIAGNOSIS — J301 Allergic rhinitis due to pollen: Secondary | ICD-10-CM

## 2024-03-23 MED ORDER — AZELASTINE HCL 0.1 % NA SOLN: 2.0000 | Freq: Two times a day (BID) | NASAL | 6 refills | Status: AC | PRN

## 2024-03-23 MED ORDER — PHENTERMINE HCL 37.5 MG PO TABS: 37.5000 mg | ORAL_TABLET | Freq: Every day | ORAL | 1 refills | Status: AC

## 2024-03-23 NOTE — Progress Notes (Signed)
 MyChart Video Visit Virtual Visit via Video Note   This visit type was conducted w/patient consent. This format is felt to be most appropriate for this patient at this time. Physical exam was limited by quality of the video and audio technology used for the visit. CMA was able to get the patient set up on a video visit.  Patient location: Home. Patient and provider in visit Provider location: Office  I discussed the limitations of evaluation and management by telemedicine and the availability of in person appointments. The patient expressed understanding and agreed to proceed.  Visit Date: 03/23/2024  Today's healthcare provider: Ellsworth Haas, MD     Subjective:    Patient ID: Jerry Schmidt, male    DOB: December 04, 1980, 43 y.o.   MRN: [id number]  Chief Complaint  Patient presents with   Follow-up    4 week follow-up on weight     HPI Wt 232 at home.  Wt was 245 at home here 249 Discussed the use of AI scribe software for clinical note transcription with the patient, who gave verbal consent to proceed.  History of Present Illness Jerry Schmidt is a 43 year old male who presents for weight management and follow-up on phentermine  use.  He weighs 232 pounds, down from approximately 245 pounds a month ago. He attributes this weight loss to the use of phentermine  and some exercise, although he notes difficulty in maintaining a regular workout schedule due to increased work demands. He has also reduced his food intake.  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 He has not checked his blood pressure recently. No chest pains, heart racing, shakiness, jitteriness, or thoughts of suicide.  He reports nasal congestion, which he attributes to pollen, affecting his sleep. He is currently using Zyrtec but avoids nasal sprays like Flonase  due to adverse effects such as increased  stuffiness. He uses a product called Angelita Bares, which contains oils like peppermint oil, to help open his nasal passages, but notes it only provides temporary relief.  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 30 tablet 0   No facility-administered medications prior to visit.    No Known Allergies      Objective:     Physical Exam  Vitals and nursing note reviewed.  Constitutional:      General:  is not in acute distress.    Appearance: Normal appearance.  HENT:     Head: Normocephalic.  Pulmonary:     Effort: No respiratory distress.  Skin:    General: Skin is dry.     Coloration: Skin is not pale.  Neurological:     Mental Status: Pt is alert and oriented to person, place, and time.  Psychiatric:  Mood and Affect: Mood normal.   Ht 5\' 10"  (1.778 m)   Wt 232 lb (105.2 kg)   BMI 33.29 kg/m   Wt Readings from Last 3 Encounters:  03/23/24 232 lb (105.2 kg)  02/17/24 249 lb 8 oz (113.2 kg)  09/09/23 231 lb (104.8 kg)       Assessment & Plan:   Problem List Items Addressed This Visit   None Visit Diagnoses       Class 2 severe obesity due to excess calories with serious comorbidity and body mass index (BMI) of 35.0 to 35.9 in adult [facility])    -  Primary   Relevant Medications   phentermine  (ADIPEX-P ) 37.5 MG tablet     Seasonal allergic rhinitis due to pollen       Relevant Medications   azelastine (ASTELIN) 0.1 % nasal spray      Assessment and Plan Assessment & Plan Obesity   He has experienced recent weight loss, now weighing 232 lbs, down from 245 lbs a month ago-per home scale. Phentermine  is aiding in weight loss. He exercises intermittently and has reduced food intake. Blood pressure monitoring is necessary due to potential elevation from phentermine , though he reports no side effects such as chest pain, heart racing, or jitteriness. Continue phentermine  for weight management. Encourage regular exercise and dietary modifications. Monitor blood pressure at home and report readings. Schedule blood work and an in-person visit in two months for further evaluation.  Nasal congestion   He suffers from chronic nasal congestion, likely due to allergies, which affects his sleep. He uses Zyrtec and a [employer] for symptom relief. Nasal sprays like Flonase  and Nasacort worsen congestion. He reports no epistaxis or adverse reactions to current treatments. Discussed the potential benefits of Astelin nasal spray, an antihistamine, as an alternative to steroid nasal sprays. Prescribe Astelin nasal spray for nasal congestion. Continue using the [employer] stick as needed for temporary relief. Evaluate the effectiveness of Astelin and adjust treatment if necessary.    Meds ordered this encounter  Medications   azelastine (ASTELIN) 0.1 % nasal spray    Sig: Place 2 sprays into both nostrils 2 (two) times daily as needed for rhinitis. Use in each nostril as directed    Dispense:  30 mL    Refill:  6   phentermine  (ADIPEX-P ) 37.5 MG tablet    Sig: Take 1 tablet (37.5 mg total) by mouth daily before breakfast.    Dispense:  30 tablet    Refill:  1    I discussed the assessment and treatment plan with the patient. The patient was provided an opportunity to ask questions and all were answered. The patient agreed with the plan and demonstrated an understanding of the instructions.   The patient was advised to call back or  seek an in-person evaluation if the symptoms worsen or if the condition fails to improve as anticipated.  Return in about 2 months (around 05/23/2024) for wt.  Ellsworth Haas, MD [facility] [phone] (phone) [phone] (fax)  [facility]

## 2024-03-23 NOTE — Patient Instructions (Signed)
 It was very nice to see you today!  Awesome Job!!!  Keep it up!  Try astelin-sent to pharmacy-for nose   PLEASE NOTE:  If you had any lab tests please let us  know if you have not heard back within a few days. You may see your results on MyChart before we have a chance to review them but we will give you a call once they are reviewed by us . If we ordered any referrals today, please let us  know if you have not heard from their office within the next week.   Please try these tips to maintain a healthy lifestyle:  Eat most of your calories during the day when you are active. Eliminate processed foods including packaged sweets (pies, cakes, cookies), reduce intake of potatoes, white bread, white pasta, and white rice. Look for whole grain options, oat flour or almond flour.  Each meal should contain half fruits/vegetables, one quarter protein, and one quarter carbs (no bigger than a computer mouse).  Cut down on sweet beverages. This includes juice, soda, and sweet tea. Also watch fruit intake, though this is a healthier sweet option, it still contains natural sugar! Limit to 3 servings daily.  Drink at least 1 glass of water with each meal and aim for at least 8 glasses per day  Exercise at least 150 minutes every week.

## 2025-03-02 ENCOUNTER — Encounter: Admitting: Family Medicine
